# Patient Record
Sex: Male | Born: 1955 | Race: Black or African American | Hispanic: No | Marital: Married | Smoking: Never smoker
Health system: Southern US, Community
[De-identification: ages and names within clinical notes are randomized; demographics above are authoritative.]

## PROBLEM LIST (undated history)

## (undated) DIAGNOSIS — I10 Essential (primary) hypertension: Secondary | ICD-10-CM

## (undated) DIAGNOSIS — I509 Heart failure, unspecified: Secondary | ICD-10-CM

## (undated) DIAGNOSIS — E785 Hyperlipidemia, unspecified: Secondary | ICD-10-CM

## (undated) DIAGNOSIS — E119 Type 2 diabetes mellitus without complications: Secondary | ICD-10-CM

## (undated) HISTORY — PX: INGUINAL HERNIA REPAIR: SUR1180

## (undated) HISTORY — DX: Hyperlipidemia, unspecified: E78.5

---

## 2020-03-13 ENCOUNTER — Ambulatory Visit (HOSPITAL_COMMUNITY)
Admission: EM | Admit: 2020-03-13 | Discharge: 2020-03-13 | Disposition: A | Discharge status: Home / Self Care | Payer: Medicare Other | Attending: Family Medicine | Admitting: Family Medicine

## 2020-03-13 ENCOUNTER — Encounter (HOSPITAL_COMMUNITY): Payer: Self-pay

## 2020-03-13 ENCOUNTER — Other Ambulatory Visit: Payer: Self-pay

## 2020-03-13 DIAGNOSIS — E119 Type 2 diabetes mellitus without complications: Secondary | ICD-10-CM | POA: Diagnosis present

## 2020-03-13 DIAGNOSIS — I10 Essential (primary) hypertension: Secondary | ICD-10-CM | POA: Insufficient documentation

## 2020-03-13 DIAGNOSIS — Z760 Encounter for issue of repeat prescription: Secondary | ICD-10-CM | POA: Insufficient documentation

## 2020-03-13 HISTORY — DX: Essential (primary) hypertension: I10

## 2020-03-13 HISTORY — DX: Type 2 diabetes mellitus without complications: E11.9

## 2020-03-13 LAB — CBC
HCT: 44.3 % (ref 39.0–52.0)
Hemoglobin: 14.4 g/dL (ref 13.0–17.0)
MCH: 26.4 pg (ref 26.0–34.0)
MCHC: 32.5 g/dL (ref 30.0–36.0)
MCV: 81.3 fL (ref 80.0–100.0)
Platelets: 270 10*3/uL (ref 150–400)
RBC: 5.45 MIL/uL (ref 4.22–5.81)
RDW: 13.7 % (ref 11.5–15.5)
WBC: 5.2 10*3/uL (ref 4.0–10.5)
nRBC: 0 % (ref 0.0–0.2)

## 2020-03-13 LAB — COMPREHENSIVE METABOLIC PANEL
ALT: 29 U/L (ref 0–44)
AST: 24 U/L (ref 15–41)
Albumin: 4.2 g/dL (ref 3.5–5.0)
Alkaline Phosphatase: 63 U/L (ref 38–126)
Anion gap: 9 (ref 5–15)
BUN: 12 mg/dL (ref 8–23)
CO2: 28 mmol/L (ref 22–32)
Calcium: 9.6 mg/dL (ref 8.9–10.3)
Chloride: 101 mmol/L (ref 98–111)
Creatinine, Ser: 0.87 mg/dL (ref 0.61–1.24)
GFR calc Af Amer: >60 mL/min (ref >60)
GFR calc non Af Amer: >60 mL/min (ref >60)
Glucose, Bld: 134 mg/dL — ABNORMAL HIGH (ref 70–99)
Potassium: 4.3 mmol/L (ref 3.5–5.1)
Sodium: 138 mmol/L (ref 135–145)
Total Bilirubin: 0.6 mg/dL (ref 0.3–1.2)
Total Protein: 7.1 g/dL (ref 6.5–8.1)

## 2020-03-13 LAB — POCT URINALYSIS DIP (DEVICE)
Bilirubin Urine: NEGATIVE
Glucose, UA: 100 mg/dL — AB
Ketones, ur: NEGATIVE mg/dL
Leukocytes,Ua: NEGATIVE
Nitrite: NEGATIVE
Protein, ur: NEGATIVE mg/dL
Specific Gravity, Urine: >=1.03 (ref 1.005–1.030)
Urobilinogen, UA: 0.2 mg/dL (ref 0.0–1.0)
pH: 5.5 (ref 5.0–8.0)

## 2020-03-13 LAB — HEMOGLOBIN A1C
Hgb A1c MFr Bld: 7.4 % — ABNORMAL HIGH (ref 4.8–5.6)
Mean Plasma Glucose: 165.68 mg/dL

## 2020-03-13 MED ORDER — LINAGLIPTIN 5 MG PO TABS: 5.0000 mg | ORAL_TABLET | Freq: Every day | ORAL | 0 refills | Status: DC

## 2020-03-13 MED ORDER — LISINOPRIL 10 MG PO TABS: 10.0000 mg | ORAL_TABLET | Freq: Every day | ORAL | 0 refills | Status: DC

## 2020-03-13 MED ORDER — METOPROLOL SUCCINATE ER 25 MG PO TB24: 25.0000 mg | ORAL_TABLET | Freq: Every day | ORAL | 0 refills | Status: DC

## 2020-03-13 MED ORDER — ATORVASTATIN CALCIUM 20 MG PO TABS: 20.0000 mg | ORAL_TABLET | Freq: Every day | ORAL | 0 refills | Status: DC

## 2020-03-13 MED ORDER — GLYBURIDE 5 MG PO TABS: 5.0000 mg | ORAL_TABLET | Freq: Two times a day (BID) | ORAL | 0 refills | Status: DC

## 2020-03-13 NOTE — ED Triage Notes (Signed)
Pt states he needs prescription refills for Lisinopril 10mg , glyburide 5mg , atorvastatin 20mg , trajentin 5mg , metoprolol 25mg . Pt states he moved here from and doesn't have a PCP yet.

## 2020-03-13 NOTE — ED Provider Notes (Signed)
[facility]    CSN: [id number] Arrival date & time: 03/13/20  1315      History   Chief Complaint Chief Complaint  Patient presents with  . medication refill    HPI Devin Goodman is a 64 y.o. male.   Devin Goodman presents with requests for medication refill. He moved here from [place] in November 2020 and hasn't yet established with a PCP. He otherwise feels well, no headache, no dizziness, no chest pain , no leg swelling. He states that his hga1c had improved at his last visit as far as he knows, he thought it was slightly over 7 but isn't sure exactly. No shortness of breath . Ran out of lisinopril a few weeks ago and is almost out of the other medications. Taking metoprolol, trajenta, glyburide, lisinopril, atorvastatin.    ROS per HPI, negative if not otherwise mentioned.      Past Medical History:  Diagnosis Date  . Diabetes mellitus without complication (HCC)   . Hypertension     There are no problems to display for this patient.   Past Surgical History:  Procedure Laterality Date  . INGUINAL HERNIA REPAIR Left        Home Medications    Prior to Admission medications   Medication Sig Start Date End Date Taking? Authorizing Provider  atorvastatin (LIPITOR) 20 MG tablet Take 1 tablet (20 mg total) by mouth daily. 03/13/20   Georgetta Haber, NP  glyBURIDE (DIABETA) 5 MG tablet Take 1 tablet (5 mg total) by mouth 2 (two) times daily with a meal. 03/13/20   Avaline Stillson, Barron Alvine, NP  linagliptin (TRADJENTA) 5 MG TABS tablet Take 1 tablet (5 mg total) by mouth daily. 03/13/20   Linus Mako B, NP  lisinopril (ZESTRIL) 10 MG tablet Take 1 tablet (10 mg total) by mouth daily. 03/13/20   Georgetta Haber, NP  metoprolol succinate (TOPROL XL) 25 MG 24 hr tablet Take 1 tablet (25 mg total) by mouth daily. 03/13/20   Georgetta Haber, NP    Family History Family History  Problem Relation Age of Onset  . Cancer Mother     Social History Social History    Tobacco Use  . Smoking status: Never Smoker  . Smokeless tobacco: Never Used  Substance Use Topics  . Alcohol use: Yes    Comment: occ  . Drug use: Never     Allergies   Penicillins   Review of Systems Review of Systems   Physical Exam Triage Vital Signs ED Triage Vitals [03/13/20 1418]  Enc Vitals Group     BP (!) 145/93     Pulse Rate 92     Resp 18     Temp 98.4 F (36.9 C)     Temp Source Oral     SpO2 95 %     Weight 195 lb (88.5 kg)     Height 5\' 11"  (1.803 m)     Head Circumference      Peak Flow      Pain Score 0     Pain Loc      Pain Edu?      Excl. in GC?    No data found.  Updated Vital Signs BP (!) 145/93 (BP Location: Left Arm)   Pulse 92   Temp 98.4 F (36.9 C) (Oral)   Resp 18   Ht 5\' 11"  (1.803 m)   Wt 195 lb (88.5 kg)   SpO2 95%   BMI 27.20  kg/m   Visual Acuity Right Eye Distance:   Left Eye Distance:   Bilateral Distance:    Right Eye Near:   Left Eye Near:    Bilateral Near:     Physical Exam Constitutional:      Appearance: He is well-developed.  Cardiovascular:     Rate and Rhythm: Normal rate.  Pulmonary:     Effort: Pulmonary effort is normal.  Skin:    General: Skin is warm and dry.  Neurological:     Mental Status: He is alert and oriented to person, place, and time.      UC Treatments / Results  Labs (all labs ordered are listed, but only abnormal results are displayed) Labs Reviewed  POCT URINALYSIS DIP (DEVICE) - Abnormal; Notable for the following components:      Result Value   Glucose, UA 100 (*)    Hgb urine dipstick SMALL (*)    All other components within normal limits  CBC  COMPREHENSIVE METABOLIC PANEL  HEMOGLOBIN A1C    EKG   Radiology No results found.  Procedures Procedures (including critical care time)  Medications Ordered in UC Medications - No data to display  Initial Impression / Assessment and Plan / UC Course  I have reviewed the triage vital signs and the nursing  notes.  Pertinent labs & imaging results that were available during my care of the patient were reviewed by me and considered in my medical decision making (see chart for details).     30 day supply of medications refilled after confirming with [facility]. They state he had been on lisinopril 5 mg as well. He states he had been taking 10mg  most recently, therefore this dose was refilled today. Basic labs obtained with encouragement to follow up with a PCP for recheck and management of medications long term. Patient verbalized understanding and agreeable to plan.   Final Clinical Impressions(s) / UC Diagnoses   Final diagnoses:  Hypertension, unspecified type  Type 2 diabetes mellitus without complication, without long-term current use of insulin (HCC)  Medication refill     Discharge Instructions     I have refilled your medications for 30 days. Please call to establish with a PCP for long term management of these medications, I have listed two options, or you may also explore the [facility] website for additional options.    ED Prescriptions    Medication Sig Dispense Auth. Provider   lisinopril (ZESTRIL) 10 MG tablet Take 1 tablet (10 mg total) by mouth daily. 30 tablet Augusto Gamble B, NP   linagliptin (TRADJENTA) 5 MG TABS tablet Take 1 tablet (5 mg total) by mouth daily. 30 tablet Augusto Gamble B, NP   glyBURIDE (DIABETA) 5 MG tablet Take 1 tablet (5 mg total) by mouth 2 (two) times daily with a meal. 60 tablet Augusto Gamble B, NP   metoprolol succinate (TOPROL XL) 25 MG 24 hr tablet Take 1 tablet (25 mg total) by mouth daily. 30 tablet Augusto Gamble B, NP   atorvastatin (LIPITOR) 20 MG tablet Take 1 tablet (20 mg total) by mouth daily. 30 tablet Zigmund Gottron, NP     PDMP not reviewed this encounter.   Zigmund Gottron, NP 03/13/20 1525

## 2020-03-13 NOTE — Discharge Instructions (Signed)
I have refilled your medications for 30 days. Please call to establish with a PCP for long term management of these medications, I have listed two options, or you may also explore the [facility] website for additional options.

## 2020-03-14 ENCOUNTER — Telehealth (HOSPITAL_COMMUNITY): Payer: Self-pay | Admitting: *Deleted

## 2020-03-14 NOTE — Telephone Encounter (Signed)
Reviewed labs with patient. Education provided on importance of compliance with diabetes medication. Patient was able to fill prescriptions with no issues. No questions.

## 2020-05-26 ENCOUNTER — Ambulatory Visit: Payer: Self-pay | Admitting: Family Medicine

## 2020-07-04 ENCOUNTER — Encounter: Payer: Self-pay | Admitting: Family Medicine

## 2020-07-04 ENCOUNTER — Other Ambulatory Visit: Payer: Self-pay

## 2020-07-04 ENCOUNTER — Ambulatory Visit (INDEPENDENT_AMBULATORY_CARE_PROVIDER_SITE_OTHER): Payer: Medicare Other | Admitting: Family Medicine

## 2020-07-04 VITALS — BP 152/91 | HR 78 | Temp 98.2°F | Ht 71.5 in | Wt 192.2 lb

## 2020-07-04 DIAGNOSIS — E785 Hyperlipidemia, unspecified: Secondary | ICD-10-CM | POA: Insufficient documentation

## 2020-07-04 DIAGNOSIS — R319 Hematuria, unspecified: Secondary | ICD-10-CM

## 2020-07-04 DIAGNOSIS — E1165 Type 2 diabetes mellitus with hyperglycemia: Secondary | ICD-10-CM | POA: Diagnosis not present

## 2020-07-04 DIAGNOSIS — I10 Essential (primary) hypertension: Secondary | ICD-10-CM | POA: Diagnosis not present

## 2020-07-04 MED ORDER — ATORVASTATIN CALCIUM 20 MG PO TABS: 20.0000 mg | ORAL_TABLET | Freq: Every day | ORAL | 3 refills | Status: DC

## 2020-07-04 MED ORDER — LISINOPRIL 10 MG PO TABS: 10.0000 mg | ORAL_TABLET | Freq: Every day | ORAL | 3 refills | Status: DC

## 2020-07-04 MED ORDER — GLYBURIDE 5 MG PO TABS: 5.0000 mg | ORAL_TABLET | Freq: Two times a day (BID) | ORAL | 3 refills | Status: DC

## 2020-07-04 MED ORDER — METOPROLOL SUCCINATE ER 25 MG PO TB24: 25.0000 mg | ORAL_TABLET | Freq: Every day | ORAL | 3 refills | Status: DC

## 2020-07-04 NOTE — Patient Instructions (Signed)
It was very nice to see you today!  Please work on the exercises.  No medication changes today.  We will check a urine sample today.  I will see back in 6 months. Please come back to see me sooner if needed.  Take care, Dr [name]  Please try these tips to maintain a healthy lifestyle:   Eat at least 3 REAL meals and 1-2 snacks per day.  Aim for no more than 5 hours between eating.  If you eat breakfast, please do so within one hour of getting up.    Each meal should contain half fruits/vegetables, one quarter protein, and one quarter carbs (no bigger than a computer mouse)   Cut down on sweet beverages. This includes juice, soda, and sweet tea.     Drink at least 1 glass of water with each meal and aim for at least 8 glasses per day   Exercise at least 150 minutes every week.

## 2020-07-04 NOTE — Progress Notes (Signed)
Devin Goodman is a 64 y.o. male who presents today for an office visit.  Assessment/Plan:  New/Acute Problems: Polyuria UA from a few months ago showed glucosuria and hematuria. Will recheck UA today and urine culture. May have BPH though it has persistent hematuria will likely need referral to urology.  Hip Flexor Weakness Discussed home exercises and handout was given. If no improvement would consider referral to PT.  Chronic Problems Addressed Today: Essential hypertension Above goal though typically well controlled per patient. Will continue lisinopril 10 mg daily and metoprolol succinate 25 mg daily.  Dyslipidemia Continue Lipitor 20 mg daily. Check records for most recent lipid panel.  Type 2 diabetes mellitus with hyperglycemia (HCC) A1c 6.8. Continue Tradjenta 5 mg daily and glyburide 5 mg twice daily. Recheck 6 months.     Subjective:  HPI:  Patient is here today as a new patient. He has noticed increasing urinary frequency at night over the last several months. No dysuria. No penile discharge. Was reportedly checked for prostate cancer the past which was negative.  Is also been having some calf and hip pain. Thinks this is due to deconditioning. No obvious injuries or precipitating events.   His stable, chronic medical conditions are outlined below:  # T2DM - On tradjenta 5mg  daily and glyburide 5mg  twice daily  # Essential Hypertension - On lisinopril 10mg  daily, metoprolol 25mg  daily  and tolerating well  # Dyslipidemia - On atorvastatin 20mg  daily and tolerating well  PMH:  The following were reviewed and entered/updated in epic: Past Medical History:  Diagnosis Date  . Diabetes mellitus without complication (HCC)   . Hyperlipidemia   . Hypertension    Patient Active Problem List   Diagnosis Date Noted  . Type 2 diabetes mellitus with hyperglycemia (HCC) 07/04/2020  . Dyslipidemia 07/04/2020  . Essential hypertension 07/04/2020   Past  Surgical History:  Procedure Laterality Date  . INGUINAL HERNIA REPAIR Left     Family History  Problem Relation Age of Onset  . Cancer Mother     Medications- reviewed and updated Current Outpatient Medications  Medication Sig Dispense Refill  . atorvastatin (LIPITOR) 20 MG tablet Take 1 tablet (20 mg total) by mouth daily. 90 tablet 3  . glyBURIDE (DIABETA) 5 MG tablet Take 1 tablet (5 mg total) by mouth 2 (two) times daily with a meal. 180 tablet 3  . linagliptin (TRADJENTA) 5 MG TABS tablet Take 1 tablet (5 mg total) by mouth daily. 30 tablet 0  . lisinopril (ZESTRIL) 10 MG tablet Take 1 tablet (10 mg total) by mouth daily. 90 tablet 3  . metoprolol succinate (TOPROL XL) 25 MG 24 hr tablet Take 1 tablet (25 mg total) by mouth daily. 90 tablet 3   No current facility-administered medications for this visit.    Allergies-reviewed and updated Allergies  Allergen Reactions  . Penicillins     vomiting    Social History   Socioeconomic History  . Marital status: Married    Spouse name: Not on file  . Number of children: Not on file  . Years of education: Not on file  . Highest education level: Not on file  Occupational History  . Not on file  Tobacco Use  . Smoking status: Never Smoker  . Smokeless tobacco: Never Used  Vaping Use  . Vaping Use: Never used  Substance and Sexual Activity  . Alcohol use: Yes    Comment: occ  . Drug use: Never  . Sexual activity: Not  on file  Other Topics Concern  . Not on file  Social History Narrative  . Not on file   Social Determinants of Health   Financial Resource Strain:   . Difficulty of Paying Living Expenses:   Food Insecurity:   . Worried About Programme researcher, broadcasting/film/video in the Last Year:   . Barista in the Last Year:   Transportation Needs:   . Freight forwarder (Medical):   Marland Kitchen Lack of Transportation (Non-Medical):   Physical Activity:   . Days of Exercise per Week:   . Minutes of Exercise per Session:     Stress:   . Feeling of Stress :   Social Connections:   . Frequency of Communication with Friends and Family:   . Frequency of Social Gatherings with Friends and Family:   . Attends Religious Services:   . Active Member of Clubs or Organizations:   . Attends Banker Meetings:   Marland Kitchen Marital Status:            Objective:  Physical Exam: BP (!) 152/91   Pulse 78   Temp 98.2 F (36.8 C)   Ht 5' 11.5" (1.816 m)   Wt 192 lb 3.2 oz (87.2 kg)   SpO2 97%   BMI 26.43 kg/m   Gen: No acute distress, resting comfortably CV: Regular rate and rhythm with no murmurs appreciated Pulm: Normal work of breathing, clear to auscultation bilaterally with no crackles, wheezes, or rhonchi MSK: -Bilateral hip flexor weakness noted. Some pain with resisted hip flexion. Neuro: Grossly normal, moves all extremities Psych: Normal affect and thought content      Maaliyah Adolph M. [name], MD 07/04/2020 3:29 PM

## 2020-07-04 NOTE — Assessment & Plan Note (Signed)
Continue Lipitor 20 mg daily. Check records for most recent lipid panel.

## 2020-07-04 NOTE — Addendum Note (Signed)
Addended by: Erick Alley on: 07/04/2020 04:14 PM   Modules accepted: Orders

## 2020-07-04 NOTE — Assessment & Plan Note (Signed)
A1c 6.8. Continue Tradjenta 5 mg daily and glyburide 5 mg twice daily. Recheck 6 months.

## 2020-07-04 NOTE — Assessment & Plan Note (Signed)
Above goal though typically well controlled per patient. Will continue lisinopril 10 mg daily and metoprolol succinate 25 mg daily.

## 2020-07-05 LAB — URINALYSIS, ROUTINE W REFLEX MICROSCOPIC
Bilirubin Urine: NEGATIVE
Leukocytes,Ua: NEGATIVE
Nitrite: NEGATIVE
Specific Gravity, Urine: 1.025 (ref 1.000–1.030)
Total Protein, Urine: NEGATIVE
Urine Glucose: 100 — AB
Urobilinogen, UA: 0.2 (ref 0.0–1.0)
pH: 6 (ref 5.0–8.0)

## 2020-07-05 LAB — URINE CULTURE
MICRO NUMBER:: 10670301
Result:: NO GROWTH
SPECIMEN QUALITY:: ADEQUATE

## 2020-07-05 LAB — POCT GLYCOSYLATED HEMOGLOBIN (HGB A1C): Hemoglobin A1C: 6.8 % — AB (ref 4.0–5.6)

## 2020-07-05 NOTE — Addendum Note (Signed)
Addended by: Erick Alley on: 07/05/2020 08:15 AM   Modules accepted: Orders

## 2020-07-06 ENCOUNTER — Other Ambulatory Visit: Payer: Self-pay

## 2020-07-06 DIAGNOSIS — R319 Hematuria, unspecified: Secondary | ICD-10-CM

## 2020-07-06 NOTE — Progress Notes (Signed)
Please inform patient of the following:  Still has blood in his urine - recommend referral to urology.  [name]. [name], MD 07/06/2020 12:36 PM

## 2020-07-26 NOTE — Progress Notes (Signed)
Order(s) created erroneously. Erroneous order ID: [id number]  Order moved by: Arvie Villarruel, Keshaun Dubey N  Order move date/time: 07/26/2020 11:59 AM  Source Patient: [id number]  Source Contact: 07/04/2020  Destination Patient: [id number]  Destination Contact: 03/16/2013 

## 2020-10-31 ENCOUNTER — Telehealth: Payer: Self-pay | Admitting: Family Medicine

## 2020-10-31 NOTE — Telephone Encounter (Signed)
Left message for patient to call back and schedule Medicare Annual Wellness Visit (AWV) either virtually/audio only OR in office. Whatever the patients preference is.  No hx; please schedule at anytime with [facility]-Nurse Health Advisor at [facility].  This should be a 45 minute visit.   

## 2021-01-04 ENCOUNTER — Ambulatory Visit: Payer: Medicare Other | Admitting: Family Medicine

## 2021-01-08 ENCOUNTER — Other Ambulatory Visit: Payer: Self-pay

## 2021-01-08 ENCOUNTER — Encounter: Payer: Self-pay | Admitting: Family Medicine

## 2021-01-08 ENCOUNTER — Ambulatory Visit (INDEPENDENT_AMBULATORY_CARE_PROVIDER_SITE_OTHER): Payer: Medicare Other | Admitting: Family Medicine

## 2021-01-08 VITALS — BP 124/69 | HR 97 | Temp 97.5°F | Ht 71.5 in | Wt 183.8 lb

## 2021-01-08 DIAGNOSIS — Z23 Encounter for immunization: Secondary | ICD-10-CM | POA: Diagnosis not present

## 2021-01-08 DIAGNOSIS — E1165 Type 2 diabetes mellitus with hyperglycemia: Secondary | ICD-10-CM | POA: Diagnosis not present

## 2021-01-08 DIAGNOSIS — I10 Essential (primary) hypertension: Secondary | ICD-10-CM | POA: Diagnosis not present

## 2021-01-08 LAB — POCT GLYCOSYLATED HEMOGLOBIN (HGB A1C): Hemoglobin A1C: 6 % — AB (ref 4.0–5.6)

## 2021-01-08 NOTE — Progress Notes (Signed)
   Devin Goodman is a 65 y.o. male who presents today for an office visit.  Assessment/Plan:  New/Acute Problems: Abdominal Pain Paraflex.  Reassuring abdominal exam.  Likely muscular strain given positional nature.  Decreased sensation on index finger No apparent abnormalities on exam.  Has some constipation which is likely contributing.  May have small amount of peripheral neuropathy.  Cough Potential side effect of lisinopril.Will stop today. Normal lung exam.   Chronic Problems Addressed Today: Essential hypertension Doing well.  Cough could be side effect of lisinopril.  We will stop today.  Continue home monitoring goal 140/90 or lower.  Follow-up in 6 months.  Type 2 diabetes mellitus with hyperglycemia (HCC) A1c very well controlled at 6.0.  Continue Tradjenta 5 mg daily and glyburide 5 mg twice daily.  Recheck 6 months.  If continues to be well controlled we will consider stopping or reducing dose of glyburide.     Subjective:  HPI:  See A/P for status of chronic conditions.  He has been doing well for the last several months.  He has bit of a dry cough.  No fevers or chills.  No sputum production.  Is also had intermittent left-sided abdominal pain.  No nausea or vomiting.  No constipation or diarrhea.  Occurs with certain motions such as stretching.       Objective:  Physical Exam: BP 124/69   Pulse 97   Temp (!) 97.5 F (36.4 C) (Temporal)   Ht 5' 11.5" (1.816 m)   Wt 183 lb 12.8 oz (83.4 kg)   SpO2 100%   BMI 25.28 kg/m   Wt Readings from Last 3 Encounters:  01/08/21 183 lb 12.8 oz (83.4 kg)  07/04/20 192 lb 3.2 oz (87.2 kg)  03/13/20 195 lb (88.5 kg)    Gen: No acute distress, resting comfortably CV: Regular rate and rhythm with no murmurs appreciated Pulm: Normal work of breathing, clear to auscultation bilaterally with no crackles, wheezes, or rhonchi GI: Soft, nontender, nondistended.  No rebound or guarding.  Bowel sounds present. Neuro:  Grossly normal, moves all extremities Psych: Normal affect and thought content      Devin Goodman M. [name], MD 01/08/2021 3:42 PM

## 2021-01-08 NOTE — Assessment & Plan Note (Signed)
A1c very well controlled at 6.0.  Continue Tradjenta 5 mg daily and glyburide 5 mg twice daily.  Recheck 6 months.  If continues to be well controlled we will consider stopping or reducing dose of glyburide.

## 2021-01-08 NOTE — Patient Instructions (Signed)
It was very nice to see you today!  Please try stopping lisinopril to see if this helps with your cough.  Your numbers look very good.  Please continue the good work with your diet and exercise.  I will see you back in 6 months.  We can check blood work at that time.  Please come back to see me sooner if needed.  Take care, Dr [name]  Please try these tips to maintain a healthy lifestyle:   Eat at least 3 REAL meals and 1-2 snacks per day.  Aim for no more than 5 hours between eating.  If you eat breakfast, please do so within one hour of getting up.    Each meal should contain half fruits/vegetables, one quarter protein, and one quarter carbs (no bigger than a computer mouse)   Cut down on sweet beverages. This includes juice, soda, and sweet tea.     Drink at least 1 glass of water with each meal and aim for at least 8 glasses per day   Exercise at least 150 minutes every week.

## 2021-01-08 NOTE — Assessment & Plan Note (Signed)
Doing well.  Cough could be side effect of lisinopril.  We will stop today.  Continue home monitoring goal 140/90 or lower.  Follow-up in 6 months.

## 2021-02-08 ENCOUNTER — Encounter (HOSPITAL_COMMUNITY): Payer: Self-pay | Admitting: Emergency Medicine

## 2021-02-08 ENCOUNTER — Emergency Department (HOSPITAL_COMMUNITY): Payer: Medicare Other

## 2021-02-08 ENCOUNTER — Inpatient Hospital Stay (HOSPITAL_COMMUNITY): Payer: Medicare Other

## 2021-02-08 ENCOUNTER — Other Ambulatory Visit: Payer: Self-pay

## 2021-02-08 ENCOUNTER — Inpatient Hospital Stay (HOSPITAL_COMMUNITY)
Admission: EM | Admit: 2021-02-08 | Discharge: 2021-02-14 | DRG: 286 | Disposition: A | Discharge status: Home / Self Care | Payer: Medicare Other | Attending: Internal Medicine | Admitting: Internal Medicine

## 2021-02-08 DIAGNOSIS — Z880 Allergy status to penicillin: Secondary | ICD-10-CM

## 2021-02-08 DIAGNOSIS — I471 Supraventricular tachycardia: Secondary | ICD-10-CM | POA: Diagnosis not present

## 2021-02-08 DIAGNOSIS — I161 Hypertensive emergency: Secondary | ICD-10-CM | POA: Diagnosis present

## 2021-02-08 DIAGNOSIS — N170 Acute kidney failure with tubular necrosis: Secondary | ICD-10-CM | POA: Diagnosis present

## 2021-02-08 DIAGNOSIS — J810 Acute pulmonary edema: Secondary | ICD-10-CM | POA: Diagnosis present

## 2021-02-08 DIAGNOSIS — Z7984 Long term (current) use of oral hypoglycemic drugs: Secondary | ICD-10-CM | POA: Diagnosis not present

## 2021-02-08 DIAGNOSIS — I5021 Acute systolic (congestive) heart failure: Secondary | ICD-10-CM | POA: Diagnosis not present

## 2021-02-08 DIAGNOSIS — N179 Acute kidney failure, unspecified: Secondary | ICD-10-CM | POA: Diagnosis present

## 2021-02-08 DIAGNOSIS — E1165 Type 2 diabetes mellitus with hyperglycemia: Secondary | ICD-10-CM | POA: Diagnosis present

## 2021-02-08 DIAGNOSIS — I514 Myocarditis, unspecified: Secondary | ICD-10-CM | POA: Diagnosis not present

## 2021-02-08 DIAGNOSIS — I110 Hypertensive heart disease with heart failure: Secondary | ICD-10-CM | POA: Diagnosis not present

## 2021-02-08 DIAGNOSIS — E785 Hyperlipidemia, unspecified: Secondary | ICD-10-CM | POA: Diagnosis not present

## 2021-02-08 DIAGNOSIS — Z79899 Other long term (current) drug therapy: Secondary | ICD-10-CM | POA: Diagnosis not present

## 2021-02-08 DIAGNOSIS — E876 Hypokalemia: Secondary | ICD-10-CM | POA: Diagnosis not present

## 2021-02-08 DIAGNOSIS — I5020 Unspecified systolic (congestive) heart failure: Secondary | ICD-10-CM | POA: Diagnosis not present

## 2021-02-08 DIAGNOSIS — J9601 Acute respiratory failure with hypoxia: Secondary | ICD-10-CM

## 2021-02-08 DIAGNOSIS — I428 Other cardiomyopathies: Secondary | ICD-10-CM | POA: Diagnosis present

## 2021-02-08 DIAGNOSIS — R570 Cardiogenic shock: Secondary | ICD-10-CM | POA: Diagnosis present

## 2021-02-08 DIAGNOSIS — I160 Hypertensive urgency: Secondary | ICD-10-CM | POA: Diagnosis not present

## 2021-02-08 DIAGNOSIS — R9389 Abnormal findings on diagnostic imaging of other specified body structures: Secondary | ICD-10-CM | POA: Diagnosis not present

## 2021-02-08 DIAGNOSIS — Z20822 Contact with and (suspected) exposure to covid-19: Secondary | ICD-10-CM | POA: Diagnosis present

## 2021-02-08 DIAGNOSIS — J90 Pleural effusion, not elsewhere classified: Secondary | ICD-10-CM | POA: Diagnosis present

## 2021-02-08 DIAGNOSIS — E875 Hyperkalemia: Secondary | ICD-10-CM | POA: Diagnosis not present

## 2021-02-08 DIAGNOSIS — I5022 Chronic systolic (congestive) heart failure: Secondary | ICD-10-CM

## 2021-02-08 LAB — I-STAT ARTERIAL BLOOD GAS, ED
Acid-base deficit: 2 mmol/L (ref 0.0–2.0)
Bicarbonate: 25.8 mmol/L (ref 20.0–28.0)
Calcium, Ion: 1.21 mmol/L (ref 1.15–1.40)
HCT: 53 % — ABNORMAL HIGH (ref 39.0–52.0)
Hemoglobin: 18 g/dL — ABNORMAL HIGH (ref 13.0–17.0)
O2 Saturation: 81 %
Patient temperature: 98.1
Potassium: 4.1 mmol/L (ref 3.5–5.1)
Sodium: 136 mmol/L (ref 135–145)
TCO2: 27 mmol/L (ref 22–32)
pCO2 arterial: 54.8 mmHg — ABNORMAL HIGH (ref 32.0–48.0)
pH, Arterial: 7.279 — ABNORMAL LOW (ref 7.350–7.450)
pO2, Arterial: 51 mmHg — ABNORMAL LOW (ref 83.0–108.0)

## 2021-02-08 LAB — I-STAT VENOUS BLOOD GAS, ED
Acid-base deficit: 3 mmol/L — ABNORMAL HIGH (ref 0.0–2.0)
Bicarbonate: 25.5 mmol/L (ref 20.0–28.0)
Calcium, Ion: 1.11 mmol/L — ABNORMAL LOW (ref 1.15–1.40)
HCT: 54 % — ABNORMAL HIGH (ref 39.0–52.0)
Hemoglobin: 18.4 g/dL — ABNORMAL HIGH (ref 13.0–17.0)
O2 Saturation: 74 %
Potassium: 3.8 mmol/L (ref 3.5–5.1)
Sodium: 138 mmol/L (ref 135–145)
TCO2: 27 mmol/L (ref 22–32)
pCO2, Ven: 58.6 mmHg (ref 44.0–60.0)
pH, Ven: 7.246 — ABNORMAL LOW (ref 7.250–7.430)
pO2, Ven: 47 mmHg — ABNORMAL HIGH (ref 32.0–45.0)

## 2021-02-08 LAB — COMPREHENSIVE METABOLIC PANEL
ALT: 51 U/L — ABNORMAL HIGH (ref 0–44)
AST: 47 U/L — ABNORMAL HIGH (ref 15–41)
Albumin: 4 g/dL (ref 3.5–5.0)
Alkaline Phosphatase: 76 U/L (ref 38–126)
Anion gap: 14 (ref 5–15)
BUN: 23 mg/dL (ref 8–23)
CO2: 18 mmol/L — ABNORMAL LOW (ref 22–32)
Calcium: 9.1 mg/dL (ref 8.9–10.3)
Chloride: 106 mmol/L (ref 98–111)
Creatinine, Ser: 1.31 mg/dL — ABNORMAL HIGH (ref 0.61–1.24)
GFR, Estimated: >60 mL/min (ref >60)
Glucose, Bld: 211 mg/dL — ABNORMAL HIGH (ref 70–99)
Potassium: 5.1 mmol/L (ref 3.5–5.1)
Sodium: 138 mmol/L (ref 135–145)
Total Bilirubin: 1 mg/dL (ref 0.3–1.2)
Total Protein: 7.4 g/dL (ref 6.5–8.1)

## 2021-02-08 LAB — BASIC METABOLIC PANEL
Anion gap: 14 (ref 5–15)
BUN: 23 mg/dL (ref 8–23)
CO2: 23 mmol/L (ref 22–32)
Calcium: 9.2 mg/dL (ref 8.9–10.3)
Chloride: 101 mmol/L (ref 98–111)
Creatinine, Ser: 1.29 mg/dL — ABNORMAL HIGH (ref 0.61–1.24)
GFR, Estimated: >60 mL/min (ref >60)
Glucose, Bld: 279 mg/dL — ABNORMAL HIGH (ref 70–99)
Potassium: 3.8 mmol/L (ref 3.5–5.1)
Sodium: 138 mmol/L (ref 135–145)

## 2021-02-08 LAB — CBC
HCT: 56.6 % — ABNORMAL HIGH (ref 39.0–52.0)
HCT: 50.1 % (ref 39.0–52.0)
Hemoglobin: 17.4 g/dL — ABNORMAL HIGH (ref 13.0–17.0)
Hemoglobin: 16 g/dL (ref 13.0–17.0)
MCH: 25.7 pg — ABNORMAL LOW (ref 26.0–34.0)
MCH: 26.3 pg (ref 26.0–34.0)
MCHC: 30.7 g/dL (ref 30.0–36.0)
MCHC: 31.9 g/dL (ref 30.0–36.0)
MCV: 83.7 fL (ref 80.0–100.0)
MCV: 82.3 fL (ref 80.0–100.0)
Platelets: 319 10*3/uL (ref 150–400)
Platelets: 280 10*3/uL (ref 150–400)
RBC: 6.76 MIL/uL — ABNORMAL HIGH (ref 4.22–5.81)
RBC: 6.09 MIL/uL — ABNORMAL HIGH (ref 4.22–5.81)
RDW: 14.6 % (ref 11.5–15.5)
RDW: 14.2 % (ref 11.5–15.5)
WBC: 7.5 10*3/uL (ref 4.0–10.5)
WBC: 14.9 10*3/uL — ABNORMAL HIGH (ref 4.0–10.5)
nRBC: 0 % (ref 0.0–0.2)
nRBC: 0 % (ref 0.0–0.2)

## 2021-02-08 LAB — ECHOCARDIOGRAM COMPLETE
AR max vel: 2.25 cm2
AV Area VTI: 2.12 cm2
AV Area mean vel: 1.8 cm2
AV Mean grad: 2 mmHg
AV Peak grad: 3.5 mmHg
Ao pk vel: 0.93 m/s
Area-P 1/2: 2.8 cm2
Height: 72 in
MV VTI: 1.4 cm2
S' Lateral: 5.5 cm
Weight: 2895.96 oz

## 2021-02-08 LAB — RESP PANEL BY RT-PCR (FLU A&B, COVID) ARPGX2
Influenza A by PCR: NEGATIVE
Influenza B by PCR: NEGATIVE
SARS Coronavirus 2 by RT PCR: NEGATIVE

## 2021-02-08 LAB — GLUCOSE, CAPILLARY
Glucose-Capillary: 112 mg/dL — ABNORMAL HIGH (ref 70–99)
Glucose-Capillary: 116 mg/dL — ABNORMAL HIGH (ref 70–99)
Glucose-Capillary: 121 mg/dL — ABNORMAL HIGH (ref 70–99)
Glucose-Capillary: 145 mg/dL — ABNORMAL HIGH (ref 70–99)
Glucose-Capillary: 165 mg/dL — ABNORMAL HIGH (ref 70–99)
Glucose-Capillary: 271 mg/dL — ABNORMAL HIGH (ref 70–99)

## 2021-02-08 LAB — PROTIME-INR
INR: 1 (ref 0.8–1.2)
Prothrombin Time: 13 seconds (ref 11.4–15.2)

## 2021-02-08 LAB — PROCALCITONIN: Procalcitonin: <0.1 ng/mL

## 2021-02-08 LAB — POCT I-STAT 7, (LYTES, BLD GAS, ICA,H+H)
Acid-base deficit: 2 mmol/L (ref 0.0–2.0)
Bicarbonate: 23.8 mmol/L (ref 20.0–28.0)
Calcium, Ion: 1.22 mmol/L (ref 1.15–1.40)
HCT: 48 % (ref 39.0–52.0)
Hemoglobin: 16.3 g/dL (ref 13.0–17.0)
O2 Saturation: 99 %
Patient temperature: 98.8
Potassium: 4.2 mmol/L (ref 3.5–5.1)
Sodium: 140 mmol/L (ref 135–145)
TCO2: 25 mmol/L (ref 22–32)
pCO2 arterial: 41.9 mmHg (ref 32.0–48.0)
pH, Arterial: 7.362 (ref 7.350–7.450)
pO2, Arterial: 169 mmHg — ABNORMAL HIGH (ref 83.0–108.0)

## 2021-02-08 LAB — HIV ANTIBODY (ROUTINE TESTING W REFLEX): HIV Screen 4th Generation wRfx: NONREACTIVE

## 2021-02-08 LAB — LACTIC ACID, PLASMA
Lactic Acid, Venous: 4.1 mmol/L (ref 0.5–1.9)
Lactic Acid, Venous: 3 mmol/L (ref 0.5–1.9)
Lactic Acid, Venous: 2.2 mmol/L (ref 0.5–1.9)

## 2021-02-08 LAB — BRAIN NATRIURETIC PEPTIDE: B Natriuretic Peptide: 501.9 pg/mL — ABNORMAL HIGH (ref 0.0–100.0)

## 2021-02-08 LAB — APTT: aPTT: 22 seconds — ABNORMAL LOW (ref 24–36)

## 2021-02-08 LAB — MRSA PCR SCREENING: MRSA by PCR: NEGATIVE

## 2021-02-08 LAB — PHOSPHORUS: Phosphorus: 5.6 mg/dL — ABNORMAL HIGH (ref 2.5–4.6)

## 2021-02-08 LAB — MAGNESIUM: Magnesium: 2.2 mg/dL (ref 1.7–2.4)

## 2021-02-08 LAB — TROPONIN I (HIGH SENSITIVITY)
Troponin I (High Sensitivity): 26 ng/L — ABNORMAL HIGH (ref <18)
Troponin I (High Sensitivity): 45 ng/L — ABNORMAL HIGH (ref <18)
Troponin I (High Sensitivity): 173 ng/L (ref <18)

## 2021-02-08 MED ORDER — PROPOFOL 1000 MG/100ML IV EMUL: 0.0000 ug/kg/min | INTRAVENOUS | Status: DC

## 2021-02-08 MED ORDER — FUROSEMIDE 10 MG/ML IJ SOLN
40.0000 mg | Freq: Once | INTRAMUSCULAR | Status: AC
  Administered 2021-02-08: 40 mg via INTRAVENOUS

## 2021-02-08 MED ORDER — NITROGLYCERIN IN D5W 200-5 MCG/ML-% IV SOLN
INTRAVENOUS | Status: AC
  Administered 2021-02-08: 20 ug/min via INTRAVENOUS
  Filled 2021-02-08: qty 250

## 2021-02-08 MED ORDER — SODIUM CHLORIDE 0.9 % IV SOLN: 250.0000 mL | INTRAVENOUS | Status: DC

## 2021-02-08 MED ORDER — VANCOMYCIN HCL 1500 MG/300ML IV SOLN
1500.0000 mg | Freq: Once | INTRAVENOUS | Status: DC
  Administered 2021-02-08: 1500 mg via INTRAVENOUS
  Filled 2021-02-08: qty 300

## 2021-02-08 MED ORDER — DOCUSATE SODIUM 50 MG/5ML PO LIQD
100.0000 mg | Freq: Two times a day (BID) | ORAL | Status: DC
  Filled 2021-02-08 (×2): qty 10

## 2021-02-08 MED ORDER — CHLORHEXIDINE GLUCONATE CLOTH 2 % EX PADS: 6.0000 | MEDICATED_PAD | Freq: Every day | CUTANEOUS | Status: DC

## 2021-02-08 MED ORDER — PANTOPRAZOLE SODIUM 40 MG PO PACK
40.0000 mg | PACK | Freq: Every day | ORAL | Status: DC
  Filled 2021-02-08: qty 20

## 2021-02-08 MED ORDER — VANCOMYCIN HCL 750 MG/150ML IV SOLN: 750.0000 mg | Freq: Two times a day (BID) | INTRAVENOUS | Status: DC

## 2021-02-08 MED ORDER — INSULIN ASPART 100 UNIT/ML ~~LOC~~ SOLN
0.0000 [IU] | SUBCUTANEOUS | Status: DC
  Administered 2021-02-08: 2 [IU] via SUBCUTANEOUS
  Administered 2021-02-08: 3 [IU] via SUBCUTANEOUS
  Administered 2021-02-08 – 2021-02-09 (×3): 2 [IU] via SUBCUTANEOUS
  Administered 2021-02-09: 21:00:00 5 [IU] via SUBCUTANEOUS
  Administered 2021-02-10 (×2): 2 [IU] via SUBCUTANEOUS
  Administered 2021-02-10: 11 [IU] via SUBCUTANEOUS
  Administered 2021-02-10: 2 [IU] via SUBCUTANEOUS
  Administered 2021-02-10 (×2): 5 [IU] via SUBCUTANEOUS
  Administered 2021-02-11: 2 [IU] via SUBCUTANEOUS
  Administered 2021-02-11: 5 [IU] via SUBCUTANEOUS
  Administered 2021-02-11: 3 [IU] via SUBCUTANEOUS
  Administered 2021-02-11: 8 [IU] via SUBCUTANEOUS
  Administered 2021-02-12: 3 [IU] via SUBCUTANEOUS
  Administered 2021-02-12: 2 [IU] via SUBCUTANEOUS
  Administered 2021-02-12: 3 [IU] via SUBCUTANEOUS
  Administered 2021-02-12 – 2021-02-13 (×2): 2 [IU] via SUBCUTANEOUS
  Administered 2021-02-13: 5 [IU] via SUBCUTANEOUS
  Administered 2021-02-13: 3 [IU] via SUBCUTANEOUS
  Administered 2021-02-13: 2 [IU] via SUBCUTANEOUS
  Administered 2021-02-14: 5 [IU] via SUBCUTANEOUS
  Administered 2021-02-14: 3 [IU] via SUBCUTANEOUS
  Administered 2021-02-14: 8 [IU] via SUBCUTANEOUS
  Administered 2021-02-14: 2 [IU] via SUBCUTANEOUS

## 2021-02-08 MED ORDER — CHLORHEXIDINE GLUCONATE 0.12% ORAL RINSE (MEDLINE KIT)
15.0000 mL | Freq: Two times a day (BID) | OROMUCOSAL | Status: DC
  Administered 2021-02-08 – 2021-02-09 (×2): 15 mL via OROMUCOSAL

## 2021-02-08 MED ORDER — DEXMEDETOMIDINE HCL IN NACL 400 MCG/100ML IV SOLN
0.4000 ug/kg/h | INTRAVENOUS | Status: DC
  Administered 2021-02-08: 0.7 ug/kg/h via INTRAVENOUS
  Administered 2021-02-08: 1.2 ug/kg/h via INTRAVENOUS
  Administered 2021-02-08: 0.8 ug/kg/h via INTRAVENOUS
  Administered 2021-02-09 (×2): 1.2 ug/kg/h via INTRAVENOUS
  Filled 2021-02-08 (×5): qty 100

## 2021-02-08 MED ORDER — DOCUSATE SODIUM 50 MG/5ML PO LIQD: 100.0000 mg | Freq: Two times a day (BID) | ORAL | Status: DC | PRN

## 2021-02-08 MED ORDER — PROPOFOL 1000 MG/100ML IV EMUL
5.0000 ug/kg/min | INTRAVENOUS | Status: DC
  Administered 2021-02-08: 60 ug/kg/min via INTRAVENOUS
  Filled 2021-02-08 (×2): qty 100

## 2021-02-08 MED ORDER — POLYETHYLENE GLYCOL 3350 17 G PO PACK: 17.0000 g | PACK | Freq: Every day | ORAL | Status: DC

## 2021-02-08 MED ORDER — PHENYLEPHRINE HCL-NACL 10-0.9 MG/250ML-% IV SOLN
25.0000 ug/min | INTRAVENOUS | Status: DC
  Administered 2021-02-08: 40 ug/min via INTRAVENOUS
  Administered 2021-02-08: 20 ug/min via INTRAVENOUS
  Administered 2021-02-08: 55 ug/min via INTRAVENOUS
  Filled 2021-02-08 (×4): qty 250

## 2021-02-08 MED ORDER — FENTANYL 2500MCG IN NS 250ML (10MCG/ML) PREMIX INFUSION
0.0000 ug/h | INTRAVENOUS | Status: DC
  Administered 2021-02-08: 125 ug/h via INTRAVENOUS
  Administered 2021-02-08: 50 ug/h via INTRAVENOUS
  Filled 2021-02-08 (×2): qty 250

## 2021-02-08 MED ORDER — PROPOFOL 1000 MG/100ML IV EMUL
INTRAVENOUS | Status: AC
  Administered 2021-02-08: 10 ug/kg/min via INTRAVENOUS
  Filled 2021-02-08: qty 100

## 2021-02-08 MED ORDER — CHLORHEXIDINE GLUCONATE CLOTH 2 % EX PADS
6.0000 | MEDICATED_PAD | Freq: Every day | CUTANEOUS | Status: DC
  Administered 2021-02-08 – 2021-02-14 (×7): 6 via TOPICAL

## 2021-02-08 MED ORDER — HEPARIN SODIUM (PORCINE) 5000 UNIT/ML IJ SOLN
5000.0000 [IU] | Freq: Three times a day (TID) | INTRAMUSCULAR | Status: DC
  Administered 2021-02-08 – 2021-02-09 (×3): 5000 [IU] via SUBCUTANEOUS
  Filled 2021-02-08 (×3): qty 1

## 2021-02-08 MED ORDER — FUROSEMIDE 10 MG/ML IJ SOLN
40.0000 mg | Freq: Once | INTRAMUSCULAR | Status: AC
  Administered 2021-02-08: 40 mg via INTRAVENOUS
  Filled 2021-02-08: qty 4

## 2021-02-08 MED ORDER — POLYETHYLENE GLYCOL 3350 17 G PO PACK: 17.0000 g | PACK | Freq: Every day | ORAL | Status: DC | PRN

## 2021-02-08 MED ORDER — FUROSEMIDE 10 MG/ML IJ SOLN
INTRAMUSCULAR | Status: AC
  Filled 2021-02-08: qty 4

## 2021-02-08 MED ORDER — ROCURONIUM BROMIDE 50 MG/5ML IV SOLN
INTRAVENOUS | Status: AC | PRN
  Administered 2021-02-08: 100 mg via INTRAVENOUS

## 2021-02-08 MED ORDER — SODIUM CHLORIDE 0.9 % IV SOLN
2.0000 g | Freq: Three times a day (TID) | INTRAVENOUS | Status: DC
  Administered 2021-02-08: 2 g via INTRAVENOUS
  Filled 2021-02-08: qty 2

## 2021-02-08 MED ORDER — ORAL CARE MOUTH RINSE
15.0000 mL | OROMUCOSAL | Status: DC
  Administered 2021-02-08 – 2021-02-09 (×5): 15 mL via OROMUCOSAL

## 2021-02-08 MED ORDER — ETOMIDATE 2 MG/ML IV SOLN
INTRAVENOUS | Status: AC | PRN
  Administered 2021-02-08 (×2): 10 mg via INTRAVENOUS

## 2021-02-08 MED ORDER — LABETALOL HCL 5 MG/ML IV SOLN
10.0000 mg | INTRAVENOUS | Status: DC | PRN
  Administered 2021-02-10: 10 mg via INTRAVENOUS
  Administered 2021-02-10: 20 mg via INTRAVENOUS
  Filled 2021-02-08 (×2): qty 4

## 2021-02-08 MED ORDER — IOHEXOL 350 MG/ML SOLN
100.0000 mL | Freq: Once | INTRAVENOUS | Status: AC | PRN
  Administered 2021-02-08: 100 mL via INTRAVENOUS

## 2021-02-08 MED ORDER — METOPROLOL TARTRATE 25 MG PO TABS: 25.0000 mg | ORAL_TABLET | Freq: Two times a day (BID) | ORAL | Status: DC

## 2021-02-08 MED ORDER — NITROGLYCERIN IN D5W 200-5 MCG/ML-% IV SOLN: 0.0000 ug/min | INTRAVENOUS | Status: DC

## 2021-02-08 MED ORDER — PANTOPRAZOLE SODIUM 40 MG IV SOLR: 40.0000 mg | Freq: Every day | INTRAVENOUS | Status: DC

## 2021-02-08 MED ORDER — PROPOFOL 1000 MG/100ML IV EMUL: 5.0000 ug/kg/min | INTRAVENOUS | Status: DC

## 2021-02-08 MED ORDER — PHENYLEPHRINE HCL-NACL 10-0.9 MG/250ML-% IV SOLN
INTRAVENOUS | Status: AC
  Administered 2021-02-08: 25 ug/min via INTRAVENOUS
  Filled 2021-02-08: qty 250

## 2021-02-08 MED ORDER — FENTANYL CITRATE (PF) 100 MCG/2ML IJ SOLN
25.0000 ug | INTRAMUSCULAR | Status: DC | PRN
  Administered 2021-02-08: 25 ug via INTRAVENOUS
  Filled 2021-02-08: qty 2

## 2021-02-08 NOTE — Progress Notes (Signed)
NAME:  Devin Goodman, MRN:  [id number], DOB:  08/23/1956, LOS: 0 ADMISSION DATE:  02/08/2021, CONSULTATION DATE:  02/08/21 REFERRING MD:  Pilar Plate  CHIEF COMPLAINT:  SOB   Brief History   Arvid Marengo is a 65 y.o. male who was admitted 2/10 with acute hypoxic respiratory failure and hypertensive urgency.  He required intubation in ED.   In ED, he was significantly hypertensive with highest SBP of 226.  He was started on propofol and nitro.  CTA chest neg for PE but did demonstrate bilateral GGO's with pulmonary edema and small effusions.   COVID negative and procalcitonin low.    Significant Hospital Events   2/10 > admit.  Consults:  None.  Procedures:  ETT 2/10 >   Significant Diagnostic Tests:  CTA chest 2/10 > no PE.  B/l GGO's, small effusions.  Micro Data:  Blood 2/10 >  Sputum 2/10 >  COVID 2/10 >  Flu 2/10 >   Antimicrobials:  Vanc 2/10 > 2/10 Zosyn 2/10 >  2/10  Interim history/subjective:  Very arousable on fentanyl only this morning and trying to actively remove ETT. In restraints. Propofol stopped due to hypotension.   Objective:  Blood pressure 98/75, pulse 89, temperature 98.8 F (37.1 C), resp. rate 16, height 6' (1.829 m), weight 82.1 kg, SpO2 100 %.    Vent Mode: PRVC FiO2 (%):  [80 %-100 %] 80 % Set Rate:  [16 bmp] 16 bmp Vt Set:  [620 mL] 620 mL PEEP:  [10 cmH20] 10 cmH20 Plateau Pressure:  [22 cmH20] 22 cmH20   Intake/Output Summary (Last 24 hours) at 02/08/2021 0757 Last data filed at 02/08/2021 0700 Gross per 24 hour  Intake 180.12 ml  Output 1100 ml  Net -919.88 ml   Filed Weights   02/08/21 0330 02/08/21 0617  Weight: 83 kg 82.1 kg    Examination: On exam is intubated, arousable and moves purposefully RRR, no mrg Breath sounds clear to auscultation No edema Normal bowel sounds, abdomen soft.    Labs reviewed Troponin is elevated 173   EKG personally reviewed NSR, normal axis, t wave inversion in lateral leads, ST segment  changes noted in V1-V3  CT Chest personally reviewed which shows no PE, diffuse GGOs, bilateral pleural effusions   Assessment & Plan:   Acute hypoxic respiratory failure - multifactorial, presumed acute pulmonary edema in setting hypertensive urgency  - procalcitonin low. cxr more suspicious for pulmonary edema than a focal bacterial pna - d/c abx - Continue full vent support with LTVV.  - Bronchial hygiene. - 40mg  Lasix x 1 this morning   Hypertensive urgency. - off nitro due to hypotension. Off propofol as well. Currently in sedation related circulatory shock requiring neosynephrine. Will titrate down.  - PRN labetalol for above goal. - 40mg  lasix x 1. - Trend troponin, lactate. - resume home metoprolol. Discussed with patient's wife - she notes that he is "non-compliant" and isn't sure if he is taking medications at home or not. May have been holding lisinopril due to cough.   Type 2 DM - SSI. - Hold home Glyburide, Linagliptin.  AKI - 40mg  lasix x 1. - Follow BMP.   Best Practice (evaluated daily):  Diet: NPO. Pain/Anxiety/Delirium protocol (if indicated): Propofol gtt / Fentanyl PRN.  RASS goal -1. VAP protocol (if indicated): In place. DVT prophylaxis: SCD's / Heparin. GI prophylaxis: PPI. Glucose control: SSI. Mobility: Bedrest. Disposition: ICU.  Goals of Care:  Last date of multidisciplinary goals of care discussion: None. Family  and staff present: None. Summary of discussion: None. Follow up goals of care discussion due: 2/17. Code Status:  Full.   The patient is critically ill due to respiratory failure and circulatory shock. I am actively titrating pressors and doing vent management.  They require high complexity decision making for assessment and support, frequent evaluation and titration of therapies, application of advanced monitoring technologies and extensive interpretation of multiple databases.   Critical Care Time devoted to patient care  services described in this note is 45 minutes. This time reflects time of care of this signee Charlott Holler . This critical care time does not reflect separately billable procedures or procedure time, teaching time or supervisory time of PA/NP/Med student/Med Resident etc but could involve care discussion time.  Charlott Holler [facility] 02/08/2021 8:01 AM  Pager: see AMION After hours pager: [phone]  If no response to pager , please call [phone] until 7pm After 7:00 pm call Elink  [phone]     Labs   CBC: Recent Labs  Lab 02/08/21 0115 02/08/21 0318 02/08/21 0326 02/08/21 0636  WBC 7.5  --   --   --   HGB 17.4* 18.0* 18.4* 16.3  HCT 56.6* 53.0* 54.0* 48.0  MCV 83.7  --   --   --   PLT 319  --   --   --    Basic Metabolic Panel: Recent Labs  Lab 02/08/21 0115 02/08/21 0308 02/08/21 0318 02/08/21 0326 02/08/21 0636  NA 138 138 136 138 140  K 5.1 3.8 4.1 3.8 4.2  CL 106 101  --   --   --   CO2 18* 23  --   --   --   GLUCOSE 211* 279*  --   --   --   BUN 23 23  --   --   --   CREATININE 1.31* 1.29*  --   --   --   CALCIUM 9.1 9.2  --   --   --   MG  --  2.2  --   --   --   PHOS  --  5.6*  --   --   --    GFR: Estimated Creatinine Clearance: 61.8 mL/min (A) (by C-G formula based on SCr of 1.29 mg/dL (H)). Recent Labs  Lab 02/08/21 0115 02/08/21 0308  PROCALCITON  --  <0.10  WBC 7.5  --   LATICACIDVEN 4.1* 3.0*   Liver Function Tests: Recent Labs  Lab 02/08/21 0115  AST 47*  ALT 51*  ALKPHOS 76  BILITOT 1.0  PROT 7.4  ALBUMIN 4.0   No results for input(s): LIPASE, AMYLASE in the last 168 hours. No results for input(s): AMMONIA in the last 168 hours. ABG    Component Value Date/Time   PHART 7.362 02/08/2021 0636   PCO2ART 41.9 02/08/2021 0636   PO2ART 169 (H) 02/08/2021 0636   HCO3 23.8 02/08/2021 0636   TCO2 25 02/08/2021 0636   ACIDBASEDEF 2.0 02/08/2021 0636   O2SAT 99.0 02/08/2021 0636     Coagulation Profile: Recent Labs  Lab 02/08/21 0308  INR 1.0   Cardiac Enzymes: No results for input(s): CKTOTAL, CKMB, CKMBINDEX, TROPONINI in the last 168 hours. HbA1C: Hemoglobin A1C  Date/Time Value Ref Range Status  01/08/2021 03:29 PM 6.0 (A) 4.0 - 5.6 % Final  07/05/2020 08:14 AM 6.8 (A) 4.0 - 5.6 % Final   Hgb A1c MFr Bld  Date/Time Value Ref Range Status  03/13/2020 02:36 PM 7.4 (H) 4.8 - 5.6 % Final    Comment:    (NOTE) Pre diabetes:          5.7%-6.4% Diabetes:              >6.4% Glycemic control for   <7.0% adults with diabetes    CBG: No results for input(s): GLUCAP in the last 168 hours.

## 2021-02-08 NOTE — ED Provider Notes (Signed)
[facility]-EMERGENCY DEPT [facility] Emergency Department Provider Note MRN:  [id number]  Arrival date & time: 02/08/21     Chief Complaint   Shortness of Breath   History of Present Illness   Devin Goodman is a 65 y.o. year-old male with a history of hypertension, diabetes presenting to the ED with chief complaint of shortness of breath.  Acute onset shortness of breath this evening, found to be in the 70s on room air with EMS, BiPAP started in route.  Patient is breathless, agitated/combative, begging for help.  Denies chest pain.  I was unable to obtain an accurate HPI, PMH, or ROS due to the patient's hypoxic respiratory failure.  Level 5 caveat.  Review of Systems  Positive for hypoxic respiratory failure.  Patient's Health History    Past Medical History:  Diagnosis Date  . Diabetes mellitus without complication (HCC)   . Hyperlipidemia   . Hypertension     Past Surgical History:  Procedure Laterality Date  . INGUINAL HERNIA REPAIR Left     Family History  Problem Relation Age of Onset  . Cancer Mother     Social History   Socioeconomic History  . Marital status: Married    Spouse name: Not on file  . Number of children: Not on file  . Years of education: Not on file  . Highest education level: Not on file  Occupational History  . Not on file  Tobacco Use  . Smoking status: Never Smoker  . Smokeless tobacco: Never Used  Vaping Use  . Vaping Use: Never used  Substance and Sexual Activity  . Alcohol use: Yes    Comment: occ  . Drug use: Never  . Sexual activity: Not on file  Other Topics Concern  . Not on file  Social History Narrative  . Not on file   Social Determinants of Health   Financial Resource Strain: Not on file  Food Insecurity: Not on file  Transportation Needs: Not on file  Physical Activity: Not on file  Stress: Not on file  Social Connections: Not on file  Intimate Partner Violence: Not on file     Physical Exam   Vitals:    02/08/21 0203 02/08/21 0215  BP: (!) 193/143 (!) 168/133  Pulse: (!) 114 (!) 113  Resp: 16 16  Temp:  97.7 F (36.5 C)  SpO2: 99% 96%    CONSTITUTIONAL: Ill-appearing, in severe respiratory distress NEURO: Awake, agitated, moving all extremities EYES:  eyes equal and reactive ENT/NECK:  no LAD, no JVD CARDIO: Tachycardic rate, well-perfused, normal S1 and S2 PULM: Diffuse rhonchi GI/GU:  normal bowel sounds, non-distended, non-tender MSK/SPINE:  No gross deformities, no edema SKIN:  no rash, atraumatic PSYCH:  Appropriate speech and behavior  *Additional and/or pertinent findings included in MDM below  Diagnostic and Interventional Summary    EKG Interpretation  Date/Time:  Thursday February 08 2021 01:15:56 EST Ventricular Rate:  140 PR Interval:    QRS Duration: 98 QT Interval:  288 QTC Calculation: 440 R Axis:   67 Text Interpretation: Sinus tachycardia Multiform ventricular premature complexes LAE, consider biatrial enlargement LVH with secondary repolarization abnormality Anterior infarct, acute (LAD) Baseline wander in lead(s) V4 Confirmed by Kennis Carina [phone]) on 02/08/2021 1:45:37 AM      Labs Reviewed  CBC - Abnormal; Notable for the following components:      Result Value   RBC 6.76 (*)    Hemoglobin 17.4 (*)    HCT 56.6 (*)  MCH 25.7 (*)    All other components within normal limits  COMPREHENSIVE METABOLIC PANEL - Abnormal; Notable for the following components:   CO2 18 (*)    Glucose, Bld 211 (*)    Creatinine, Ser 1.31 (*)    AST 47 (*)    ALT 51 (*)    All other components within normal limits  LACTIC ACID, PLASMA - Abnormal; Notable for the following components:   Lactic Acid, Venous 4.1 (*)    All other components within normal limits  TROPONIN I (HIGH SENSITIVITY) - Abnormal; Notable for the following components:   Troponin I (High Sensitivity) 26 (*)    All other components within normal limits  RESP PANEL BY RT-PCR (FLU A&B, COVID)  ARPGX2  CULTURE, BLOOD (ROUTINE X 2)  CULTURE, BLOOD (ROUTINE X 2)  URINE CULTURE  CULTURE, RESPIRATORY  BRAIN NATRIURETIC PEPTIDE  PROTIME-INR  APTT  BLOOD GAS, ARTERIAL  LACTIC ACID, PLASMA  LACTIC ACID, PLASMA  HIV ANTIBODY (ROUTINE TESTING W REFLEX)  CBC  BASIC METABOLIC PANEL  BLOOD GAS, ARTERIAL  MAGNESIUM  PHOSPHORUS  PROCALCITONIN    DG Chest Portable 1 View  Final Result    CT ANGIO CHEST PE W OR WO CONTRAST  Final Result    DG Chest Portable 1 View  Final Result    DG Chest Port 1 View    (Results Pending)    Medications  nitroGLYCERIN 50 mg in dextrose 5 % 250 mL (0.2 mg/mL) infusion (80 mcg/min Intravenous Rate/Dose Change 02/08/21 0223)  propofol (DIPRIVAN) 1000 MG/100ML infusion (20 mcg/kg/min Intravenous Rate/Dose Change 02/08/21 0136)  furosemide (LASIX) injection 40 mg (has no administration in time range)  labetalol (NORMODYNE) injection 10-20 mg (has no administration in time range)  heparin injection 5,000 Units (has no administration in time range)  pantoprazole (PROTONIX) injection 40 mg (has no administration in time range)  docusate sodium (COLACE) capsule 100 mg (has no administration in time range)  polyethylene glycol (MIRALAX / GLYCOLAX) packet 17 g (has no administration in time range)  docusate (COLACE) 50 MG/5ML liquid 100 mg (has no administration in time range)  polyethylene glycol (MIRALAX / GLYCOLAX) packet 17 g (has no administration in time range)  fentaNYL (SUBLIMAZE) injection 25 mcg (has no administration in time range)  insulin aspart (novoLOG) injection 0-15 Units (has no administration in time range)  etomidate (AMIDATE) injection (10 mg Intravenous Given 02/08/21 0104)  rocuronium (ZEMURON) injection (100 mg Intravenous Given 02/08/21 0106)  iohexol (OMNIPAQUE) 350 MG/ML injection 100 mL (100 mLs Intravenous Contrast Given 02/08/21 0141)     Procedures  /  Critical Care .Critical Care Performed by: Sabas Sous,  MD Authorized by: Sabas Sous, MD   Critical care provider statement:    Critical care time (minutes):  80   Critical care was necessary to treat or prevent imminent or life-threatening deterioration of the following conditions:  Respiratory failure   Critical care was time spent personally by me on the following activities:  Discussions with consultants, evaluation of patient's response to treatment, examination of patient, ordering and performing treatments and interventions, ordering and review of laboratory studies, ordering and review of radiographic studies, pulse oximetry, re-evaluation of patient's condition, obtaining history from patient or surrogate and review of old charts Procedure Name: Intubation Date/Time: 02/08/2021 1:46 AM Performed by: Sabas Sous, MD Pre-anesthesia Checklist: Patient identified, Patient being monitored, Emergency Drugs available, Timeout performed and Suction available Oxygen Delivery Method: Non-rebreather mask Preoxygenation: Pre-oxygenation with 100%  oxygen Induction Type: Rapid sequence Ventilation: Mask ventilation without difficulty Laryngoscope Size: Glidescope and 4 Grade View: Grade I Tube size: 7.5 mm Number of attempts: 2 Airway Equipment and Method: Rigid stylet Placement Confirmation: ETT inserted through vocal cords under direct vision,  CO2 detector and Breath sounds checked- equal and bilateral Secured at: 25 cm Tube secured with: ETT holder Comments: Patient successfully intubated on first attempt with size 3 glide scope.  RSI using 20 of etomidate and 100 of rocuronium.  It was then noticed that patient had a significant cuff leak and the ventilator was not functioning properly.  Began to have some desaturations.  Decision made to remove ET tube and replace.  Replaced with another 7.5 tube this time using size 4 glide scope which provided a better view.       ED Course and Medical Decision Making  I have reviewed the triage  vital signs, the nursing notes, and pertinent available records from the EMR.  Listed above are laboratory and imaging tests that I personally ordered, reviewed, and interpreted and then considered in my medical decision making (see below for details).  Initial concern for acute pulmonary edema given the severe hypertension, diffuse rhonchi, significant respiratory distress.  Peculiar given that patient has no history of CHF.  And so also considering MI with valvular failure, the patient denies any chest pain.  Patient arrives gasping for breath, grabbing and kicking at staff, not tolerating BiPAP.  Decision made to sedate patient using 10 mg etomidate, which facilitated bag-valve-mask ventilation.  During patient's intolerance of BiPAP he managed to decrease his oxygen saturation into the 50s and 60s.  We were able to improve this to the 70s and 80s with bag-valve-mask.  At that point that was is optimized as I felt the patient could get.  An additional 10 mg of etomidate was given as well as 100 mg rocuronium.  Intubated as described above.  At the same time patient being started on nitro drip, propofol for sedation.  Patient's oxygen saturation improving to the 90s after intubation.  Also considering pulmonary embolism, rapid CTA obtained and I see no obvious saddle or large PE.  EKG is demonstrating some ST elevations but I do not feel it meets criteria for STEMI, will consult cardiology fellow to see with a think. Clinical Course as of 02/08/21 0231  Thu Feb 08, 2021  0158 EKG reviewed by Dr. Scarlette Calico of [facility] cardiology at bedside, favoring benign early repole.  She agrees that this does not meet STEMI criteria, recommending trending of troponins. [MB]    Clinical Course User Index [MB] Sabas Sous, MD     Patient has reached a level of stability on the ventilator, heart rate down to 110, still hypertensive in the 190s systolic despite nitro drip and propofol drip.  Admitted to intensivist for  further care.  Elmer Sow. Pilar Plate, MD [facility] Emergency Medicine [facility] [email or web address]  Final Clinical Impressions(s) / ED Diagnoses     ICD-10-CM   1. Acute respiratory failure with hypoxia (HCC)  J96.01   2. Acute pulmonary edema (HCC)  J81.0     ED Discharge Orders    None       Discharge Instructions Discussed with and Provided to Patient:   Discharge Instructions   None       Sabas Sous, MD 02/08/21 [phone]

## 2021-02-08 NOTE — Progress Notes (Signed)
Pharmacy Antibiotic Note  Devin Goodman is a 65 y.o. male admitted on 02/08/2021 with pneumonia.  Pharmacy has been consulted for Vancomycin/Zosyn dosing. WBC WNL. Mild bump in Scr. Acute respiratory failure in the ED requiring intubation.   Plan: Vancomycin 750 mg IV q12h >>Estimated AUC: 430 Cefepime 2g IV q8h Trend WBC, temp, renal function  F/U infectious work-up Drug levels as indicated   Height: 6' (182.9 cm) IBW/kg (Calculated) : 77.6  Temp (24hrs), Avg:97.6 F (36.4 C), Min:95.2 F (35.1 C), Max:98.4 F (36.9 C)  Recent Labs  Lab 02/08/21 0115  WBC 7.5  CREATININE 1.31*  LATICACIDVEN 4.1*    CrCl cannot be calculated (Unknown ideal weight.).    Allergies  Allergen Reactions  . Penicillins     vomiting   Abran Duke, PharmD, BCPS Clinical Pharmacist Phone: [phone]

## 2021-02-08 NOTE — Progress Notes (Signed)
  Echocardiogram 2D Echocardiogram has been performed.  Gerda Diss 02/08/2021, 4:06 PM

## 2021-02-08 NOTE — ED Triage Notes (Signed)
Pt BIB [facility] from home, c/o sudden onset shortness of breath. On EMS arrival, pt agitated, diaphoretic and hypertensive. Initial SpO2 70s on room air, improved to 90 on CPAP. Pt denies chest pain.

## 2021-02-08 NOTE — Progress Notes (Signed)
Patient was initially intubated and placed on ventilator. RT and RN noticed significant air coming from ETT while ventilator was constantly alarming. MD made aware. Patient was re-intubated with a bigger size tube. The issue was resolved with a bigger ETT tube and tube being advanced to 30 cm at the lips. Patient tolerated well and is currently stable on ventilator.

## 2021-02-08 NOTE — ED Notes (Signed)
Pt agitated, attempting to climb out of bed, continuously stating "I can't breathe".

## 2021-02-08 NOTE — ED Notes (Signed)
Wife is in Consult A.

## 2021-02-08 NOTE — Progress Notes (Signed)
eLink Physician-Brief Progress Note Patient Name: Devin Goodman DOB: 02/16/1956 MRN: [id number]   Date of Service  02/08/2021  HPI/Events of Note  Hypotension - BP = 66/45 with MAP = 54. CXR review: ETT 2.5 cm above carina which is satisfactory. ABG = 7.36/42/169/23.8 which is also acceptable.   eICU Interventions  Plan: 1. Fentanyl IV infusion. Titrate to RASS = 0 to -1. 2. Wean Propofol IV infusion off.  3. Phenylephrine IV infusion via PIV. Titrate to MAP >= 65.      Intervention Category Major Interventions: Hypotension - evaluation and management  Terilyn Sano Dennard Nip 02/08/2021, 6:47 AM

## 2021-02-08 NOTE — Progress Notes (Signed)
Patient transported to 2M16 without complications.

## 2021-02-08 NOTE — ED Notes (Signed)
Provider notified of low BP. Nitro paused at this time. Propofol titrated down to . Pt in NAD. Will await further orders.

## 2021-02-08 NOTE — ED Notes (Signed)
Attempted to call report; RN unable to take report at this time.

## 2021-02-08 NOTE — Progress Notes (Signed)
CRITICAL VALUE ALERT  Critical Value:  Troponin 173  Date & Time Notied:  02/08/21 @0952   Provider Notified: Dr.  Orders Received/Actions taken: No orders received at this time.

## 2021-02-08 NOTE — H&P (Signed)
NAME:  Devin Goodman, MRN:  [id number], DOB:  May 30, 1956, LOS: 0 ADMISSION DATE:  02/08/2021, CONSULTATION DATE:  02/08/21 REFERRING MD:  Pilar Plate  CHIEF COMPLAINT:  SOB   Brief History   Devin Goodman is a 65 y.o. male who was admitted 2/10 with acute hypoxic respiratory failure and hypertensive urgency.  He required intubation in ED.  CTA with diffuse GGO's.  COVID pending.  History of present illness   Pt is encephelopathic; therefore, this HPI is obtained from chart review. Devin Goodman is a 65 y.o. male who has a PMH including but not limited to DM, HTN, HLD.  He presented to [facility] ED 2/10 with significant dyspnea, agitation, diaphoresis.  He was trialed on BiPAP; however, sats dropped into 50's and 60's.  He was subsequently intubated.  In ED, he was significantly hypertensive with highest SBP of 226.  He was started on propofol and nitro.  CTA chest neg for PE but did demonstrate bilateral GGO's with pulmonary edema and small effusions.  Some concern for PNA as well as COVID PNA.  COVID PCR pending.  Past Medical History  has Type 2 diabetes mellitus with hyperglycemia (HCC); Dyslipidemia; Essential hypertension; and Hypertensive urgency on their problem list.  Significant Hospital Events   2/10 > admit.  Consults:  None.  Procedures:  ETT 2/10 >   Significant Diagnostic Tests:  CTA chest 2/10 > no PE.  B/l GGO's, small effusions.  Micro Data:  Blood 2/10 >  Sputum 2/10 >  COVID 2/10 >  Flu 2/10 >   Antimicrobials:  Vanc 2/10 >  Zosyn 2/10 >    Interim history/subjective:  Sedated, comfortable.  Objective:  Blood pressure (!) 168/133, pulse (!) 113, temperature 97.7 F (36.5 C), resp. rate 16, height 6' (1.829 m), SpO2 96 %.    Vent Mode: PRVC FiO2 (%):  [100 %] 100 % Set Rate:  [16 bmp] 16 bmp Vt Set:  [620 mL] 620 mL PEEP:  [10 cmH20] 10 cmH20 Plateau Pressure:  [22 cmH20] 22 cmH20  No intake or output data in the 24 hours ending 02/08/21 0226 There were no  vitals filed for this visit.  Examination: General: Adult male, in NAD. Neuro: Sedated, minimally responsive. HEENT: [place]/AT. Sclerae anicteric.  ETT in place. Cardiovascular: RRR, no M/R/G.  Lungs: Respirations even and unlabored.  Rhonchi bilaterally. Abdomen: BS x 4, soft, NT/ND.  Musculoskeletal: No gross deformities, no edema.  Skin: Intact, warm, no rashes.  Assessment & Plan:   Acute hypoxic respiratory failure - multifactorial, presumed acute pulmonary edema in setting hypertensive urgency but also can not rule out bacterial PNA or COVID PNA given diffuse GGO's. - Continue full vent support. - SBT in AM. - Bronchial hygiene. - Empiric abx with vanc / zosyn for now. - Follow cultures as well as COVID PCR. - 40mg  Lasix x 1. - Follow CXR.  Hypertensive urgency. - Continue Nitro as needed, goal SBP 170 initially. - PRN labetalol for above goal. - 40mg  lasix x 1. - Trend troponin, lactate. - Hold home Toprol-XL.  Hx DM. - SSI. - Hold home Glyburide, Linagliptin.  AKI. - 40mg  lasix x 1. - Follow BMP.   Best Practice (evaluated daily):  Diet: NPO. Pain/Anxiety/Delirium protocol (if indicated): Propofol gtt / Fentanyl PRN.  RASS goal -1. VAP protocol (if indicated): In place. DVT prophylaxis: SCD's / Heparin. GI prophylaxis: PPI. Glucose control: SSI. Mobility: Bedrest. Disposition: ICU.  Goals of Care:  Last date of multidisciplinary goals of care discussion:  None. Family and staff present: None. Summary of discussion: None. Follow up goals of care discussion due: 2/17. Code Status:  Full.  Labs   CBC: Recent Labs  Lab 02/08/21 0115  WBC 7.5  HGB 17.4*  HCT 56.6*  MCV 83.7  PLT 319   Basic Metabolic Panel: Recent Labs  Lab 02/08/21 0115  NA 138  K 5.1  CL 106  CO2 18*  GLUCOSE 211*  BUN 23  CREATININE 1.31*  CALCIUM 9.1   GFR: CrCl cannot be calculated (Unknown ideal weight.). Recent Labs  Lab 02/08/21 0115  WBC 7.5  LATICACIDVEN  4.1*   Liver Function Tests: Recent Labs  Lab 02/08/21 0115  AST 47*  ALT 51*  ALKPHOS 76  BILITOT 1.0  PROT 7.4  ALBUMIN 4.0   No results for input(s): LIPASE, AMYLASE in the last 168 hours. No results for input(s): AMMONIA in the last 168 hours. ABG No results found for: PHART, PCO2ART, PO2ART, HCO3, TCO2, ACIDBASEDEF, O2SAT  Coagulation Profile: No results for input(s): INR, PROTIME in the last 168 hours. Cardiac Enzymes: No results for input(s): CKTOTAL, CKMB, CKMBINDEX, TROPONINI in the last 168 hours. HbA1C: Hemoglobin A1C  Date/Time Value Ref Range Status  01/08/2021 03:29 PM 6.0 (A) 4.0 - 5.6 % Final  07/05/2020 08:14 AM 6.8 (A) 4.0 - 5.6 % Final   Hgb A1c MFr Bld  Date/Time Value Ref Range Status  03/13/2020 02:36 PM 7.4 (H) 4.8 - 5.6 % Final    Comment:    (NOTE) Pre diabetes:          5.7%-6.4% Diabetes:              >6.4% Glycemic control for   <7.0% adults with diabetes    CBG: No results for input(s): GLUCAP in the last 168 hours.  Review of Systems:   Unable to obtain as pt is encephalopathic.  Past medical history  He,  has a past medical history of Diabetes mellitus without complication (HCC), Hyperlipidemia, and Hypertension.   Surgical History    Past Surgical History:  Procedure Laterality Date  . INGUINAL HERNIA REPAIR Left      Social History   reports that he has never smoked. He has never used smokeless tobacco. He reports current alcohol use. He reports that he does not use drugs.   Family history   His family history includes Cancer in his mother.   Allergies Allergies  Allergen Reactions  . Penicillins     vomiting     Home meds  Prior to Admission medications   Medication Sig Start Date End Date Taking? Authorizing Provider  atorvastatin (LIPITOR) 20 MG tablet Take 1 tablet (20 mg total) by mouth daily. 07/04/20   Ardith Dark, MD  glyBURIDE (DIABETA) 5 MG tablet Take 1 tablet (5 mg total) by mouth 2 (two) times daily  with a meal. 07/04/20   Ardith Dark, MD  linagliptin (TRADJENTA) 5 MG TABS tablet Take 1 tablet (5 mg total) by mouth daily. 03/13/20   Georgetta Haber, NP  metoprolol succinate (TOPROL XL) 25 MG 24 hr tablet Take 1 tablet (25 mg total) by mouth daily. 07/04/20   Ardith Dark, MD    Critical care time: 35 min.   Rutherford Guys, [place] [facility] For pager details, please see AMION 02/08/2021, 2:34 AM

## 2021-02-09 ENCOUNTER — Inpatient Hospital Stay: Payer: Self-pay

## 2021-02-09 ENCOUNTER — Inpatient Hospital Stay (HOSPITAL_COMMUNITY): Payer: Medicare Other

## 2021-02-09 DIAGNOSIS — I5021 Acute systolic (congestive) heart failure: Secondary | ICD-10-CM | POA: Diagnosis not present

## 2021-02-09 DIAGNOSIS — R9389 Abnormal findings on diagnostic imaging of other specified body structures: Secondary | ICD-10-CM | POA: Diagnosis not present

## 2021-02-09 DIAGNOSIS — J9601 Acute respiratory failure with hypoxia: Secondary | ICD-10-CM | POA: Diagnosis not present

## 2021-02-09 DIAGNOSIS — E1165 Type 2 diabetes mellitus with hyperglycemia: Secondary | ICD-10-CM

## 2021-02-09 DIAGNOSIS — I5020 Unspecified systolic (congestive) heart failure: Secondary | ICD-10-CM

## 2021-02-09 DIAGNOSIS — N179 Acute kidney failure, unspecified: Secondary | ICD-10-CM | POA: Diagnosis not present

## 2021-02-09 DIAGNOSIS — J810 Acute pulmonary edema: Secondary | ICD-10-CM | POA: Diagnosis not present

## 2021-02-09 LAB — CBC
HCT: 43.9 % (ref 39.0–52.0)
Hemoglobin: 14.8 g/dL (ref 13.0–17.0)
MCH: 27 pg (ref 26.0–34.0)
MCHC: 33.7 g/dL (ref 30.0–36.0)
MCV: 80.1 fL (ref 80.0–100.0)
Platelets: 187 10*3/uL (ref 150–400)
RBC: 5.48 MIL/uL (ref 4.22–5.81)
RDW: 14.4 % (ref 11.5–15.5)
WBC: 8.3 10*3/uL (ref 4.0–10.5)
nRBC: 0 % (ref 0.0–0.2)

## 2021-02-09 LAB — BASIC METABOLIC PANEL
Anion gap: 18 — ABNORMAL HIGH (ref 5–15)
BUN: 27 mg/dL — ABNORMAL HIGH (ref 8–23)
CO2: 18 mmol/L — ABNORMAL LOW (ref 22–32)
Calcium: 9 mg/dL (ref 8.9–10.3)
Chloride: 107 mmol/L (ref 98–111)
Creatinine, Ser: 1.28 mg/dL — ABNORMAL HIGH (ref 0.61–1.24)
GFR, Estimated: >60 mL/min (ref >60)
Glucose, Bld: 129 mg/dL — ABNORMAL HIGH (ref 70–99)
Potassium: 3.9 mmol/L (ref 3.5–5.1)
Sodium: 143 mmol/L (ref 135–145)

## 2021-02-09 LAB — COOXEMETRY PANEL
Carboxyhemoglobin: 0.9 % (ref 0.5–1.5)
Methemoglobin: 0.8 % (ref 0.0–1.5)
O2 Saturation: 71.2 %
Total hemoglobin: 14.3 g/dL (ref 12.0–16.0)

## 2021-02-09 LAB — GLUCOSE, CAPILLARY
Glucose-Capillary: 121 mg/dL — ABNORMAL HIGH (ref 70–99)
Glucose-Capillary: 113 mg/dL — ABNORMAL HIGH (ref 70–99)
Glucose-Capillary: 134 mg/dL — ABNORMAL HIGH (ref 70–99)
Glucose-Capillary: 100 mg/dL — ABNORMAL HIGH (ref 70–99)
Glucose-Capillary: 230 mg/dL — ABNORMAL HIGH (ref 70–99)
Glucose-Capillary: 117 mg/dL — ABNORMAL HIGH (ref 70–99)

## 2021-02-09 LAB — PROCALCITONIN: Procalcitonin: 0.6 ng/mL

## 2021-02-09 LAB — TRIGLYCERIDES: Triglycerides: 63 mg/dL (ref <150)

## 2021-02-09 MED ORDER — SODIUM CHLORIDE 0.9% FLUSH: 10.0000 mL | INTRAVENOUS | Status: DC | PRN

## 2021-02-09 MED ORDER — FUROSEMIDE 10 MG/ML IJ SOLN
40.0000 mg | Freq: Two times a day (BID) | INTRAMUSCULAR | Status: DC
  Administered 2021-02-09 – 2021-02-10 (×2): 40 mg via INTRAVENOUS
  Filled 2021-02-09 (×2): qty 4

## 2021-02-09 MED ORDER — POLYETHYLENE GLYCOL 3350 17 G PO PACK
17.0000 g | PACK | Freq: Every day | ORAL | Status: DC
  Administered 2021-02-09: 17 g via ORAL
  Filled 2021-02-09: qty 1

## 2021-02-09 MED ORDER — DIGOXIN 125 MCG PO TABS
0.1250 mg | ORAL_TABLET | Freq: Every day | ORAL | Status: DC
  Administered 2021-02-09 – 2021-02-14 (×7): 0.125 mg via ORAL
  Filled 2021-02-09 (×6): qty 1

## 2021-02-09 MED ORDER — PANTOPRAZOLE SODIUM 40 MG PO TBEC: 40.0000 mg | DELAYED_RELEASE_TABLET | Freq: Every day | ORAL | Status: DC

## 2021-02-09 MED ORDER — DOCUSATE SODIUM 100 MG PO CAPS
100.0000 mg | ORAL_CAPSULE | Freq: Two times a day (BID) | ORAL | Status: DC
  Administered 2021-02-09 – 2021-02-14 (×7): 100 mg via ORAL
  Filled 2021-02-09 (×8): qty 1

## 2021-02-09 MED ORDER — DOCUSATE SODIUM 100 MG PO CAPS: 100.0000 mg | ORAL_CAPSULE | Freq: Two times a day (BID) | ORAL | Status: DC | PRN

## 2021-02-09 MED ORDER — ENOXAPARIN SODIUM 40 MG/0.4ML ~~LOC~~ SOLN
40.0000 mg | SUBCUTANEOUS | Status: DC
  Administered 2021-02-09 – 2021-02-11 (×3): 40 mg via SUBCUTANEOUS
  Filled 2021-02-09 (×3): qty 0.4

## 2021-02-09 MED ORDER — SODIUM CHLORIDE 0.9% FLUSH
10.0000 mL | Freq: Two times a day (BID) | INTRAVENOUS | Status: DC
  Administered 2021-02-09 – 2021-02-14 (×8): 10 mL

## 2021-02-09 MED ORDER — POLYETHYLENE GLYCOL 3350 17 G PO PACK: 17.0000 g | PACK | Freq: Every day | ORAL | Status: DC | PRN

## 2021-02-09 MED ORDER — MILRINONE LACTATE IN DEXTROSE 20-5 MG/100ML-% IV SOLN
0.2500 ug/kg/min | INTRAVENOUS | Status: DC
  Administered 2021-02-09 – 2021-02-10 (×3): 0.25 ug/kg/min via INTRAVENOUS
  Filled 2021-02-09 (×3): qty 100

## 2021-02-09 MED ORDER — ORAL CARE MOUTH RINSE
15.0000 mL | Freq: Two times a day (BID) | OROMUCOSAL | Status: DC
  Administered 2021-02-09 – 2021-02-14 (×9): 15 mL via OROMUCOSAL

## 2021-02-09 NOTE — Progress Notes (Signed)
Pt extubated to Bipap.

## 2021-02-09 NOTE — Progress Notes (Signed)
eLink Physician-Brief Progress Note Patient Name: Devin Goodman DOB: [birth year] MRN: [id number]   Date of Service  02/09/2021  HPI/Events of Note  Patient with sinus tachycardia with PAC's on 12 lead EKG, blood pressure is stable. Patient is admitted with cardiomyopathy with systolic heart failure.  eICU Interventions  Continue to monitor patient.        Thomasene Lot Ogan 02/09/2021, 8:21 PM

## 2021-02-09 NOTE — Progress Notes (Signed)
Called and notified Elink that only procalcitonin and triglycerides were ordered for 5am labs. I recommend repeating the troponin and lactic acid because the last values were critical and there was no order to follow up. MD order BMP and CBC.

## 2021-02-09 NOTE — Progress Notes (Signed)
NAME:  Devin Goodman, MRN:  [id number], DOB:  1956/07/24, LOS: 1 ADMISSION DATE:  02/08/2021, CONSULTATION DATE:  02/08/21 REFERRING MD:  Pilar Plate  CHIEF COMPLAINT:  SOB   Brief History   Devin Goodman is a 66 y.o. male who was admitted 2/10 with acute hypoxic respiratory failure and hypertensive urgency.  He required intubation in ED.   In ED, he was significantly hypertensive with highest SBP of 226.  He was started on propofol and nitro.  CTA chest neg for PE but did demonstrate bilateral GGO's with pulmonary edema and small effusions.   COVID negative and procalcitonin low.    Significant Hospital Events   2/10 > admit.  Consults:  None.  Procedures:  ETT 2/10 >   Significant Diagnostic Tests:  CTA chest 2/10 > no PE.  B/l GGO's, small effusions.  Micro Data:  Blood 2/10 >  Sputum 2/10 >  COVID 2/10 >  Flu 2/10 >   Antimicrobials:  Vanc 2/10 > 2/10 Zosyn 2/10 >  2/10  Interim history/subjective:  This morning awake and calm. On an SBT. Following command and passing.   Objective:  Blood pressure 90/65, pulse 66, temperature (!) 96.5 F (35.8 C), temperature source Axillary, resp. rate 17, height 6' (1.829 m), weight 80.3 kg, SpO2 100 %.    Vent Mode: PSV FiO2 (%):  [40 %] 40 % Set Rate:  [16 bmp] 16 bmp Vt Set:  [620 mL] 620 mL PEEP:  [5 cmH20-10 cmH20] 5 cmH20 Pressure Support:  [5 cmH20] 5 cmH20 Plateau Pressure:  [20 cmH20-22 cmH20] 22 cmH20   Intake/Output Summary (Last 24 hours) at 02/09/2021 1025 Last data filed at 02/09/2021 0700 Gross per 24 hour  Intake 1640.26 ml  Output 2375 ml  Net -734.74 ml   Filed Weights   02/08/21 0330 02/08/21 0617 02/09/21 0015  Weight: 83 kg 82.1 kg 80.3 kg    Examination: On exam he is intubated, calm, following commands Lungs are clear to auscultation bilaterally There is no peripheral edema His heart sounds are regular rate and rhythm, no murmurs rubs or gallops   Labs reviewed Sodium is 143, creatinine 1.28,  hemoglobin 14.8, blood sugars controlled most recently 113   Assessment & Plan:   Acute hypoxic respiratory failure -likely in the setting of acute pulmonary edema - procalcitonin low. cxr more suspicious for pulmonary edema than a focal bacterial pna - He is passing his SBT this morning.  We will extubate to BiPAP  New onset heart failure reduced ejection fraction Echocardiogram shows EF of 20% He did have some troponin elevation as well.  Concern for ischemic insult which could have caused his reduction in ejection fraction Cardiology consulted If he needs a vasoactive medication will prefer norepinephrine  Hypertensive urgency. -Resolved.  He was briefly in circulatory shock yesterday, now I think this was probably cardiogenic.  He is now off all vasoactive agents.  Blood pressure still a little on the low side now.  We will hold all agents until he can have a cardiology evaluation.  May need some more Lasix as well.  Wife notes noncompliance to medications at home.  Type 2 DM - SSI. - Hold home Glyburide, Linagliptin.  AKI -Creatinine stabilized around 1.3.  This may be at baseline and could be CKD. - Follow BMP.   Best Practice (evaluated daily):  Diet: NPO.  Can advance diet once extubated Pain/Anxiety/Delirium protocol (if indicated):  VAP protocol (if indicated): In place. DVT prophylaxis: SCD's / Heparin. GI  prophylaxis: PPI. Glucose control: SSI. Mobility: Bedrest. Disposition: ICU.  Goals of Care:  Last date of multidisciplinary goals of care discussion: None. Family and staff present: None. Summary of discussion: None. Follow up goals of care discussion due: 2/17. Code Status:  Full.   The patient is critically ill due to respiratory failure.  They require high complexity decision making for assessment and support, frequent evaluation and titration of therapies, application of advanced monitoring technologies and extensive interpretation of multiple databases.    Critical Care Time devoted to patient care services described in this note is 35 minutes. This time reflects time of care of this signee Charlott Holler . This critical care time does not reflect separately billable procedures or procedure time, teaching time or supervisory time of PA/NP/Med student/Med Resident etc but could involve care discussion time.  Charlott Holler [facility] 02/09/2021 10:25 AM  Pager: see AMION After hours pager: [phone]  If no response to pager , please call [phone] until 7pm After 7:00 pm call Elink  [phone]      Labs   CBC: Recent Labs  Lab 02/08/21 0115 02/08/21 0318 02/08/21 0326 02/08/21 0636 02/08/21 0828 02/09/21 0551  WBC 7.5  --   --   --  14.9* 8.3  HGB 17.4* 18.0* 18.4* 16.3 16.0 14.8  HCT 56.6* 53.0* 54.0* 48.0 50.1 43.9  MCV 83.7  --   --   --  82.3 80.1  PLT 319  --   --   --  280 187   Basic Metabolic Panel: Recent Labs  Lab 02/08/21 0115 02/08/21 0308 02/08/21 0318 02/08/21 0326 02/08/21 0636 02/09/21 0551  NA 138 138 136 138 140 143  K 5.1 3.8 4.1 3.8 4.2 3.9  CL 106 101  --   --   --  107  CO2 18* 23  --   --   --  18*  GLUCOSE 211* 279*  --   --   --  129*  BUN 23 23  --   --   --  27*  CREATININE 1.31* 1.29*  --   --   --  1.28*  CALCIUM 9.1 9.2  --   --   --  9.0  MG  --  2.2  --   --   --   --   PHOS  --  5.6*  --   --   --   --    GFR: Estimated Creatinine Clearance: 62.3 mL/min (A) (by C-G formula based on SCr of 1.28 mg/dL (H)). Recent Labs  Lab 02/08/21 0115 02/08/21 0308 02/08/21 0828 02/09/21 0254 02/09/21 0551  PROCALCITON  --  <0.10  --  0.60  --   WBC 7.5  --  14.9*  --  8.3  LATICACIDVEN 4.1* 3.0* 2.2*  --   --    Liver Function Tests: Recent Labs  Lab 02/08/21 0115  AST 47*  ALT 51*  ALKPHOS 76  BILITOT 1.0  PROT 7.4  ALBUMIN 4.0   No results for input(s): LIPASE, AMYLASE in the last 168 hours. No results for input(s): AMMONIA in the  last 168 hours. ABG    Component Value Date/Time   PHART 7.362 02/08/2021 0636   PCO2ART 41.9 02/08/2021 0636   PO2ART 169 (H) 02/08/2021 0636   HCO3 23.8 02/08/2021 0636   TCO2 25 02/08/2021 0636   ACIDBASEDEF 2.0 02/08/2021 0636   O2SAT 99.0 02/08/2021 0636    Coagulation Profile: Recent  Labs  Lab 02/08/21 0308  INR 1.0   Cardiac Enzymes: No results for input(s): CKTOTAL, CKMB, CKMBINDEX, TROPONINI in the last 168 hours. HbA1C: Hemoglobin A1C  Date/Time Value Ref Range Status  01/08/2021 03:29 PM 6.0 (A) 4.0 - 5.6 % Final  07/05/2020 08:14 AM 6.8 (A) 4.0 - 5.6 % Final   Hgb A1c MFr Bld  Date/Time Value Ref Range Status  03/13/2020 02:36 PM 7.4 (H) 4.8 - 5.6 % Final    Comment:    (NOTE) Pre diabetes:          5.7%-6.4% Diabetes:              >6.4% Glycemic control for   <7.0% adults with diabetes    CBG: Recent Labs  Lab 02/08/21 1629 02/08/21 1943 02/08/21 2330 02/09/21 0333 02/09/21 0754  GLUCAP 121* 116* 112* 121* 113*

## 2021-02-09 NOTE — Progress Notes (Signed)
Peripherally Inserted Central Catheter Placement  The IV Nurse has discussed with the patient and/or persons authorized to consent for the patient, the purpose of this procedure and the potential benefits and risks involved with this procedure.  The benefits include less needle sticks, lab draws from the catheter, and the patient may be discharged home with the catheter. Risks include, but not limited to, infection, bleeding, blood clot (thrombus formation), and puncture of an artery; nerve damage and irregular heartbeat and possibility to perform a PICC exchange if needed/ordered by physician.  Alternatives to this procedure were also discussed.  Bard Power PICC patient education guide, fact sheet on infection prevention and patient information card has been provided to patient /or left at bedside.    PICC Placement Documentation  PICC Double Lumen 02/09/21 PICC Right Brachial 41 cm 0 cm (Active)  Indication for Insertion or Continuance of Line Vasoactive infusions 02/09/21 1803  Exposed Catheter (cm) 0 cm 02/09/21 1803  Site Assessment Clean;Dry;Intact 02/09/21 1803  Lumen #1 Status Flushed;Blood return noted 02/09/21 1803  Lumen #2 Status Flushed;Blood return noted 02/09/21 1803  Dressing Type Transparent 02/09/21 1803  Dressing Status Clean;Dry;Intact 02/09/21 1803  Antimicrobial disc in place? Yes 02/09/21 1803  Dressing Intervention New dressing 02/09/21 1803  Dressing Change Due 02/16/21 02/09/21 1803       [name] 02/09/2021, 6:05 PM

## 2021-02-09 NOTE — Consult Note (Addendum)
Advanced Heart Failure Team Consult Note   Primary Physician: Ardith Dark, MD PCP-Cardiologist:  No primary care provider on file.  Reason for Consultation: Acute Systolic Heart Failure   HPI:    Devin Goodman is seen today for evaluation of acute systolic heart failure at the request of Dr Celine Mans.   Devin Goodman is a 65 year old retired Engineer, civil (consulting) with a history of DMII, hyperlipidemia, and hypertension. Recently stopped ACEi due to cough. Says he had a LHC 4-5 years ago. No intervention was needed. No family history of coronary disease.   Relocated to [place] from [place] in 2020. He has been on metoprolol and lisinopril for a few years. Saw PCP in January had a cough so lisinopril was stopped. Over the last 2 weeks he developed progressive shortness of breath with exertion and orthopnea.   Presented to [facility] ED with increased shortness of breath. On arrival oxygen sats low and SBP > 220. Initially on Bipap but sats remained low. Urgently intubated in the ED. Started on nitro drip. CTA- negative for PE, diffuse ground glass opacity, and large areas of consolidation in the lower lungs with possible ARDs.  CXR concerning for pulmonary edema.  HS Trop [id number].  Lactic acid 4.1.  SARS 2 negative. Blood cultures obtained. Started on antibiotics for possible PNA and given 40 mg IV lasix. Echo completed and showed severely reduced LVEF <20%, normal RV, and small pericardial effusion.   Initially on nitro drip but pressure dropped so neo was started. Neo later stopped. Extubated earlier today to 4 liters [place].   Still uncomfortable lying back.    Review of Systems: [y] = yes, [ ]  = no   . General: Weight gain [ ] ; Weight loss [ ] ; Anorexia [ ] ; Fatigue [ Y]; Fever [ ] ; Chills [ ] ; Weakness [Y ]  . Cardiac: Chest pain/pressure [ ] ; Resting SOB [Y ]; Exertional SOB [ Y]; Orthopnea [ ] ; Pedal Edema [ ] ; Palpitations [Y ]; Syncope [ ] ; Presyncope [ ] ; Paroxysmal nocturnal dyspnea[ ]   . Pulmonary: Cough [Y  ]; Wheezing[ ] ; Hemoptysis[ ] ; Sputum [ ] ; Snoring [ ]   . GI: Vomiting[ ] ; Dysphagia[ ] ; Melena[ ] ; Hematochezia [ ] ; Heartburn[ ] ; Abdominal pain [ ] ; Constipation [ ] ; Diarrhea [ ] ; BRBPR [ ]   . GU: Hematuria[ ] ; Dysuria [ ] ; Nocturia[ ]   . Vascular: Pain in legs with walking [ ] ; Pain in feet with lying flat [ ] ; Non-healing sores [ ] ; Stroke [ ] ; TIA [ ] ; Slurred speech [ ] ;  . Neuro: Headaches[ ] ; Vertigo[ ] ; Seizures[ ] ; Paresthesias[ ] ;Blurred vision [ ] ; Diplopia [ ] ; Vision changes [ ]   . Ortho/Skin: Arthritis [ ] ; Joint pain [ ] ; Muscle pain [ ] ; Joint swelling [ ] ; Back Pain [ ] ; Rash [ ]   . Psych: Depression[ ] ; Anxiety[ ]   . Heme: Bleeding problems [ ] ; Clotting disorders [ ] ; Anemia [ ]   . Endocrine: Diabetes [ Y]; Thyroid dysfunction[ ]   Home Medications Prior to Admission medications   Medication Sig Start Date End Date Taking? Authorizing Provider  aspirin EC 81 MG tablet Take 81 mg by mouth daily. Swallow whole.   Yes [provider]  atorvastatin (LIPITOR) 20 MG tablet Take 1 tablet (20 mg total) by mouth daily. 07/04/20  Yes , MD  glyBURIDE (DIABETA) 5 MG tablet Take 1 tablet (5 mg total) by mouth 2 (two) times daily with a meal. 07/04/20  Yes  M, MD  linagliptin (TRADJENTA) 5 MG TABS tablet Take 1 tablet (5 mg total) by mouth daily. 03/13/20  Yes Burky, Dorene Grebe B, NP  lisinopril (ZESTRIL) 10 MG tablet Take 10 mg by mouth daily. 01/10/21  Yes [provider]  metoprolol succinate (TOPROL XL) 25 MG 24 hr tablet Take 1 tablet (25 mg total) by mouth daily. 07/04/20  Yes Ardith Dark, MD    Past Medical History: Past Medical History:  Diagnosis Date  . Diabetes mellitus without complication (HCC)   . Hyperlipidemia   . Hypertension     Past Surgical History: Past Surgical History:  Procedure Laterality Date  . INGUINAL HERNIA REPAIR Left     Family History: Family History  Problem Relation Age of Onset  . Cancer Mother      Social History: Social History   Socioeconomic History  . Marital status: Married    Spouse name: Not on file  . Number of children: Not on file  . Years of education: Not on file  . Highest education level: Not on file  Occupational History  . Not on file  Tobacco Use  . Smoking status: Never Smoker  . Smokeless tobacco: Never Used  Vaping Use  . Vaping Use: Never used  Substance and Sexual Activity  . Alcohol use: Yes    Comment: occ  . Drug use: Never  . Sexual activity: Not on file  Other Topics Concern  . Not on file  Social History Narrative  . Not on file   Social Determinants of Health   Financial Resource Strain: Not on file  Food Insecurity: Not on file  Transportation Needs: Not on file  Physical Activity: Not on file  Stress: Not on file  Social Connections: Not on file    Allergies:  Allergies  Allergen Reactions  . Penicillins Nausea And Vomiting and Other (See Comments)    vomiting    Objective:    Vital Signs:   Temp:  [96.5 F (35.8 C)-100.2 F (37.9 C)] 98.7 F (37.1 C) (02/11 1100) Pulse Rate:  [62-76] 70 (02/11 1100) Resp:  [10-24] 12 (02/11 1100) BP: (80-130)/(61-89) 120/65 (02/11 1030) SpO2:  [98 %-100 %] 100 % (02/11 1100) FiO2 (%):  [40 %] 40 % (02/11 0905) Weight:  [80.3 kg] 80.3 kg (02/11 0015) Last BM Date:  (PTA)  Weight change: Filed Weights   02/08/21 0330 02/08/21 0617 02/09/21 0015  Weight: 83 kg 82.1 kg 80.3 kg    Intake/Output:   Intake/Output Summary (Last 24 hours) at 02/09/2021 1446 Last data filed at 02/09/2021 1030 Gross per 24 hour  Intake 1250.48 ml  Output 2210 ml  Net -959.52 ml      Physical Exam    General:. No resp difficulty HEENT: normal Neck: supple. JVP 10-11  . Carotids 2+ bilat; no bruits. No lymphadenopathy or thyromegaly appreciated. Cor: PMI nondisplaced. Regular rate & rhythm. No rubs, or murmurs. +S3  Lungs: clear on 4 liters [place]>  Abdomen: soft, nontender, nondistended. No  hepatosplenomegaly. No bruits or masses. Good bowel sounds. Extremities: no cyanosis, clubbing, rash, R and LLE from knee down cool.  Neuro: alert & orientedx3, cranial nerves grossly intact. moves all 4 extremities w/o difficulty. Affect pleasant   Telemetry  ST with episodes of SVT 100-130s   EKG    SR 100 bpm   Labs   Basic Metabolic Panel: Recent Labs  Lab 02/08/21 0115 02/08/21 0308 02/08/21 1610 02/08/21 0326 02/08/21 0636 02/09/21 0551  NA 138  138 136 138 140 143  K 5.1 3.8 4.1 3.8 4.2 3.9  CL 106 101  --   --   --  107  CO2 18* 23  --   --   --  18*  GLUCOSE 211* 279*  --   --   --  129*  BUN 23 23  --   --   --  27*  CREATININE 1.31* 1.29*  --   --   --  1.28*  CALCIUM 9.1 9.2  --   --   --  9.0  MG  --  2.2  --   --   --   --   PHOS  --  5.6*  --   --   --   --     Liver Function Tests: Recent Labs  Lab 02/08/21 0115  AST 47*  ALT 51*  ALKPHOS 76  BILITOT 1.0  PROT 7.4  ALBUMIN 4.0   No results for input(s): LIPASE, AMYLASE in the last 168 hours. No results for input(s): AMMONIA in the last 168 hours.  CBC: Recent Labs  Lab 02/08/21 0115 02/08/21 0318 02/08/21 0326 02/08/21 0636 02/08/21 0828 02/09/21 0551  WBC 7.5  --   --   --  14.9* 8.3  HGB 17.4* 18.0* 18.4* 16.3 16.0 14.8  HCT 56.6* 53.0* 54.0* 48.0 50.1 43.9  MCV 83.7  --   --   --  82.3 80.1  PLT 319  --   --   --  280 187    Cardiac Enzymes: No results for input(s): CKTOTAL, CKMB, CKMBINDEX, TROPONINI in the last 168 hours.  BNP: BNP (last 3 results) Recent Labs    02/08/21 0115  BNP 501.9*    ProBNP (last 3 results) No results for input(s): PROBNP in the last 8760 hours.   CBG: Recent Labs  Lab 02/08/21 1943 02/08/21 2330 02/09/21 0333 02/09/21 0754 02/09/21 1120  GLUCAP 116* 112* 121* 113* 134*    Coagulation Studies: Recent Labs    02/08/21 0308  LABPROT 13.0  INR 1.0     Imaging   DG Chest Port 1 View  Result Date: 02/09/2021 CLINICAL DATA:   Respiratory failure. EXAM: PORTABLE CHEST 1 VIEW COMPARISON:  02/08/2021 FINDINGS: 0318 hours. The NG tube passes into the stomach although the distal tip position is not included on the film. Endotracheal tube tip is approximately 2.3 cm above the base of the carina. Diffuse airspace disease seen on the previous study has decreased substantially in the interval. No appreciable pleural effusion. No pneumothorax. IMPRESSION: Interval improvement in diffuse bilateral airspace disease. Electronically Signed   By: Kennith Center M.D.   On: 02/09/2021 06:11   ECHOCARDIOGRAM COMPLETE  Result Date: 02/08/2021    ECHOCARDIOGRAM REPORT   Patient Name:   NOA CONSTANTE Date of Exam: 02/08/2021 Medical Rec #:  [id number]       Height:       72.0 in Accession #:    [id number]      Weight:       181.0 lb Date of Birth:  1956-11-03        BSA:          2.042 m Patient Age:    64 years        BP:           113/81 mmHg Patient Gender: M               HR:  75 bpm. Exam Location:  Inpatient Procedure: 2D Echo, Color Doppler and Cardiac Doppler                    REPORT CONTAINS CRITICAL RESULT  Severely reduced LVEF communicated via Epic with Dr. Celine Mans at 6:06 pm. Indications:    CHF  History:        Patient has no prior history of Echocardiogram examinations.                 Acute hypoxic respiratory failure and hypertensive urgency.  Sonographer:    Ross Ludwig RDCS (AE) Referring Phys: [id number] [facility]  Sonographer Comments: Echo performed with patient supine and on artificial respirator. IMPRESSIONS  1. Left ventricular ejection fraction, by estimation, is <20%. The left ventricle has severely decreased function. The left ventricle demonstrates global hypokinesis. The left ventricular internal cavity size was moderately dilated. There is moderate concentric left ventricular hypertrophy. Left ventricular diastolic parameters are indeterminate.  2. Right ventricular systolic function is moderately reduced. The right  ventricular size is normal.  3. Left atrial size was mildly dilated.  4. A small pericardial effusion is present. Large pleural effusion in both left and right lateral regions.  5. The mitral valve is normal in structure. Mild mitral valve regurgitation.  6. The aortic valve was not well visualized. There is mild calcification of the aortic valve. Aortic valve regurgitation is not visualized. Mild aortic valve sclerosis is present, with no evidence of aortic valve stenosis. Comparison(s): No prior Echocardiogram. FINDINGS  Left Ventricle: Left ventricular ejection fraction, by estimation, is <20%. The left ventricle has severely decreased function. The left ventricle demonstrates global hypokinesis. The left ventricular internal cavity size was moderately dilated. There is moderate concentric left ventricular hypertrophy. Left ventricular diastolic parameters are indeterminate. Right Ventricle: The right ventricular size is normal. Right vetricular wall thickness was not well visualized. Right ventricular systolic function is moderately reduced. Left Atrium: Left atrial size was mildly dilated. Right Atrium: Right atrial size was normal in size. Pericardium: A small pericardial effusion is present. Mitral Valve: The mitral valve is normal in structure. There is mild thickening of the mitral valve leaflet(s). There is mild calcification of the mitral valve leaflet(s). Mild mitral valve regurgitation. MV peak gradient, 1.9 mmHg. The mean mitral valve  gradient is 1.0 mmHg. Tricuspid Valve: The tricuspid valve is normal in structure. Tricuspid valve regurgitation is trivial. No evidence of tricuspid stenosis. Aortic Valve: The aortic valve was not well visualized. There is mild calcification of the aortic valve. Aortic valve regurgitation is not visualized. Mild aortic valve sclerosis is present, with no evidence of aortic valve stenosis. Aortic valve mean gradient measures 2.0 mmHg. Aortic valve peak gradient measures  3.5 mmHg. Aortic valve area, by VTI measures 2.12 cm. Pulmonic Valve: The pulmonic valve was not well visualized. Pulmonic valve regurgitation is trivial. Aorta: The aortic root, ascending aorta and aortic arch are all structurally normal, with no evidence of dilitation or obstruction. Venous: IVC assessment for right atrial pressure unable to be performed due to mechanical ventilation. IAS/Shunts: The atrial septum is grossly normal. Additional Comments: There is a large pleural effusion in both left and right lateral regions.  LEFT VENTRICLE PLAX 2D LVIDd:         6.00 cm  Diastology LVIDs:         5.50 cm  LV e' medial:    4.05 cm/s LV PW:         1.40 cm  LV E/e' medial:  12.9 LV IVS:        1.50 cm  LV e' lateral:   4.35 cm/s LVOT diam:     2.00 cm  LV E/e' lateral: 12.0 LV SV:         32 LV SV Index:   16 LVOT Area:     3.14 cm                          3D Volume EF:                         3D EF:        32 %                         LV EDV:       194 ml                         LV ESV:       133 ml                         LV SV:        62 ml RIGHT VENTRICLE            IVC RV Basal diam:  3.20 cm    IVC diam: 2.70 cm RV S prime:     8.25 cm/s TAPSE (M-mode): 1.1 cm LEFT ATRIUM             Index       RIGHT ATRIUM           Index LA diam:        3.60 cm 1.76 cm/m  RA Area:     15.90 cm LA Vol (A2C):   58.3 ml 28.55 ml/m RA Volume:   38.00 ml  18.61 ml/m LA Vol (A4C):   48.7 ml 23.85 ml/m LA Biplane Vol: 53.7 ml 26.30 ml/m  AORTIC VALVE AV Area (Vmax):    2.25 cm AV Area (Vmean):   1.80 cm AV Area (VTI):     2.12 cm AV Vmax:           93.20 cm/s AV Vmean:          76.100 cm/s AV VTI:            0.151 m AV Peak Grad:      3.5 mmHg AV Mean Grad:      2.0 mmHg LVOT Vmax:         66.70 cm/s LVOT Vmean:        43.700 cm/s LVOT VTI:          0.102 m LVOT/AV VTI ratio: 0.68  AORTA Ao Root diam: 3.60 cm Ao Asc diam:  3.40 cm MITRAL VALVE               TRICUSPID VALVE MV Area (PHT): 2.80 cm    TR Peak grad:    10.8 mmHg MV Area VTI:   1.40 cm    TR Vmax:        164.00 cm/s MV Peak grad:  1.9 mmHg MV Mean grad:  1.0 mmHg    SHUNTS MV Vmax:       0.69 m/s    Systemic VTI:  0.10 m MV Vmean:      38.4 cm/s  Systemic Diam: 2.00 cm MV Decel Time: 271 msec MV E velocity: 52.20 cm/s MV A velocity: 50.90 cm/s MV E/A ratio:  1.03 Devin Red MD Electronically signed by Devin Red MD Signature Date/Time: 02/08/2021/6:07:17 PM    Final       Medications:     Current Medications: . Chlorhexidine Gluconate Cloth  6 each Topical Daily  . docusate sodium  100 mg Oral BID  . enoxaparin (LOVENOX) injection  40 mg Subcutaneous Q24H  . insulin aspart  0-15 Units Subcutaneous Q4H  . mouth rinse  15 mL Mouth Rinse BID     Infusions: . sodium chloride    . dexmedetomidine (PRECEDEX) IV infusion 1.2 mcg/kg/hr (02/09/21 0700)      Assessment/Plan  1. Acute Hypoxic Respiratory Failure Possible CAP/pulmonary edema. -Intubated 2/10 --> extubated 4 liters [place] - Lactic acid 4.1>3>2.2 -Blood Cx- NGTD - Given dose of vanc/cefepime . Now stopped suspect pulmonary edema.    2. Hypertensive Urgency  -Recently stopped ACEi due to cough -Improved with Nitro drip--> developed hypotension. Nitro stopped.   3. Acute Systolic Heart Failure -Echo EF < 20% RV normal . No previous history of coronary disease.   -HS Trop 24>23>536 -BNP 501  - Tachycardic/cool/volume overloaded. Concerned he has low output heart failure - No bb for now with suspected low output.  -Given another 40 mg IV lasix now + digoxin 0.125 mg daily - No arb/spiro for now.  -Needs central access for CO-OX. Add milrinone 0.25 mcg.  -Will need cath to further assess hemodynamics + coronaries next week once stablized.   4. DMII On ssi   5. AKI  Creatinine on admit 1.3. Baseline < 1.   6.Tachycardia/SVT - Suspect due to low output heart failure  -Check TSH     Length of Stay: 1  Amy Clegg, NP  02/09/2021, 2:46  PM  Advanced Heart Failure Team Pager [phone] (M-F; 7a - 4p)  Please contact [facility] for night-coverage after hours (4p -7a ) and weekends on [email or web address]   Patient seen and examined with the above-signed Advanced Practice Provider and/or Housestaff. I personally reviewed laboratory data, imaging studies and relevant notes. I independently examined the patient and formulated the important aspects of the plan. I have edited the note to reflect any of my changes or salient points. I have personally discussed the plan with the patient and/or family.  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 EF 25%  Starting to feel better. But still orthopneic and tachycardic  General:  Sitting up mildly SOB HEENT: normal Neck: supple. JVP to jaw Carotids 2+ bilat; no bruits. No lymphadenopathy or thryomegaly appreciated. Cor: PMI nondisplaced. Tachy regular +s3 Lungs: clear Abdomen: soft, nontender, + distended. No hepatosplenomegaly. No bruits or masses. Good bowel sounds. Extremities: no cyanosis, clubbing, rash, edema  cool Neuro: alert & orientedx3, cranial nerves grossly intact. moves all 4 extremities w/o difficulty. 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 Now with low output and volume overload. Start milrinone, digoxin and spiro. Get central access to follow CVP and co-ox. Eventual R/L cath prior to d/c.   Arvilla Meres, MD  4:59 PM

## 2021-02-10 DIAGNOSIS — N179 Acute kidney failure, unspecified: Secondary | ICD-10-CM | POA: Diagnosis not present

## 2021-02-10 DIAGNOSIS — E876 Hypokalemia: Secondary | ICD-10-CM | POA: Diagnosis not present

## 2021-02-10 DIAGNOSIS — R570 Cardiogenic shock: Secondary | ICD-10-CM

## 2021-02-10 DIAGNOSIS — J810 Acute pulmonary edema: Secondary | ICD-10-CM | POA: Diagnosis not present

## 2021-02-10 DIAGNOSIS — J9601 Acute respiratory failure with hypoxia: Secondary | ICD-10-CM | POA: Diagnosis not present

## 2021-02-10 LAB — BASIC METABOLIC PANEL
Anion gap: 9 (ref 5–15)
BUN: 22 mg/dL (ref 8–23)
CO2: 27 mmol/L (ref 22–32)
Calcium: 8.6 mg/dL — ABNORMAL LOW (ref 8.9–10.3)
Chloride: 102 mmol/L (ref 98–111)
Creatinine, Ser: 1.08 mg/dL (ref 0.61–1.24)
GFR, Estimated: >60 mL/min (ref >60)
Glucose, Bld: 164 mg/dL — ABNORMAL HIGH (ref 70–99)
Potassium: 3.2 mmol/L — ABNORMAL LOW (ref 3.5–5.1)
Sodium: 138 mmol/L (ref 135–145)

## 2021-02-10 LAB — CULTURE, RESPIRATORY W GRAM STAIN: Culture: NORMAL

## 2021-02-10 LAB — COOXEMETRY PANEL
Carboxyhemoglobin: 1.1 % (ref 0.5–1.5)
Methemoglobin: 0.9 % (ref 0.0–1.5)
O2 Saturation: 69.2 %
Total hemoglobin: 13.2 g/dL (ref 12.0–16.0)

## 2021-02-10 LAB — GLUCOSE, CAPILLARY
Glucose-Capillary: 144 mg/dL — ABNORMAL HIGH (ref 70–99)
Glucose-Capillary: 125 mg/dL — ABNORMAL HIGH (ref 70–99)
Glucose-Capillary: 136 mg/dL — ABNORMAL HIGH (ref 70–99)
Glucose-Capillary: 215 mg/dL — ABNORMAL HIGH (ref 70–99)
Glucose-Capillary: 237 mg/dL — ABNORMAL HIGH (ref 70–99)
Glucose-Capillary: 304 mg/dL — ABNORMAL HIGH (ref 70–99)

## 2021-02-10 LAB — PROCALCITONIN: Procalcitonin: 0.22 ng/mL

## 2021-02-10 MED ORDER — CHLORPROMAZINE HCL 25 MG/ML IJ SOLN
10.0000 mg | Freq: Three times a day (TID) | INTRAMUSCULAR | Status: DC | PRN
  Administered 2021-02-10 – 2021-02-13 (×6): 10 mg via INTRAMUSCULAR
  Filled 2021-02-10 (×8): qty 0.4

## 2021-02-10 MED ORDER — SACUBITRIL-VALSARTAN 49-51 MG PO TABS
1.0000 | ORAL_TABLET | Freq: Two times a day (BID) | ORAL | Status: DC
  Administered 2021-02-10 – 2021-02-11 (×4): 1 via ORAL
  Filled 2021-02-10 (×6): qty 1

## 2021-02-10 MED ORDER — SPIRONOLACTONE 25 MG PO TABS
25.0000 mg | ORAL_TABLET | Freq: Every day | ORAL | Status: DC
  Administered 2021-02-10 – 2021-02-11 (×2): 25 mg via ORAL
  Filled 2021-02-10 (×2): qty 1

## 2021-02-10 MED ORDER — FUROSEMIDE 10 MG/ML IJ SOLN
80.0000 mg | Freq: Once | INTRAMUSCULAR | Status: AC
  Administered 2021-02-10: 80 mg via INTRAVENOUS
  Filled 2021-02-10: qty 8

## 2021-02-10 MED ORDER — POTASSIUM CHLORIDE CRYS ER 20 MEQ PO TBCR
40.0000 meq | EXTENDED_RELEASE_TABLET | Freq: Once | ORAL | Status: AC
  Administered 2021-02-10: 40 meq via ORAL
  Filled 2021-02-10: qty 2

## 2021-02-10 NOTE — Progress Notes (Signed)
   Notified by RN that CVP 1-3 on arrival to [facility], down from 7-8 earlier in the day. Patient is s/p IV lasix 80mg  today. BP stable. Will hold off on IVFs for now. No plans for additional lasix today. Favor watchful waiting.   , PA-C  02/10/21; 5:17 PM

## 2021-02-10 NOTE — Progress Notes (Signed)
Upon assessment, the patient had a fast irregular rhythm. Patient denies chest pain. Dayshift nurse said throughout the day his rhythm has been doing this, but would go back to NSR in a short amount of time. However, the last hour of her shift, the patient had stayed in this abnormal rhythm. I did a 12 lead EKG and nodified Elink of the results. No new orders at time.

## 2021-02-10 NOTE — Progress Notes (Addendum)
eLink Physician-Brief Progress Note Patient Name: Devin Goodman DOB: Dec 07, 1956 MRN: [id number]   Date of Service  02/10/2021  HPI/Events of Note  Bedside RN reports patient's CVP is registering as 2, BP 127/67, heart rate 106, saturation 96 %, RR 21.  eICU Interventions  No indication to treat with fluid boluses given stable hemodynamics and patient's history of severe cardiomyopathy with systolic cardiac dysfunction (EF 20 %). Lasix dose scheduled for a.m. will be held if CVP < 3.        Ronda Kazmi U Estelle Greenleaf 02/10/2021, 1:22 AM

## 2021-02-10 NOTE — Progress Notes (Addendum)
Pt's CVP between 1-3, confirmed with second RN. Called PA to inform the situation. BP 135/84, pt is alert and oriented times 4, oxygen @ 4 with 100%, tele running NSA According to PA, Krista, just hold fluid and lasix for today and keep monitoring.  Lonia Farber, RN

## 2021-02-10 NOTE — Progress Notes (Signed)
Patient's 12am CVP was 2. Elink notified. No new orders at this time.

## 2021-02-10 NOTE — Progress Notes (Signed)
CVP at 4 pm - 4.

## 2021-02-10 NOTE — Progress Notes (Addendum)
NAME:  Devin Goodman, MRN:  [id number], DOB:  April 01, 1956, LOS: 2 ADMISSION DATE:  02/08/2021, CONSULTATION DATE:  02/08/21 REFERRING MD:  Pilar Plate  CHIEF COMPLAINT:  SOB   Brief History   Devin Goodman is a 65 y.o. male who was admitted 2/10 with acute hypoxic respiratory failure and hypertensive urgency.  He required intubation in ED.   In ED, he was significantly hypertensive with highest SBP of 226.  He was started on propofol and nitro.  CTA chest neg for PE but did demonstrate bilateral GGO's with pulmonary edema and small effusions.   COVID negative and procalcitonin low.    Significant Hospital Events   2/10 > admit.  Consults:  None.  Procedures:  ETT 2/10 >   Significant Diagnostic Tests:  CTA chest 2/10 > no PE.  B/l GGO's, small effusions.  Micro Data:  Blood 2/10 >  Sputum 2/10 >  COVID 2/10 >  Flu 2/10 >   Antimicrobials:  Vanc 2/10 > 2/10 Zosyn 2/10 >  2/10  Interim history/subjective:  This morning awake and calm. On an SBT. Following command and passing.   Objective:  Blood pressure (!) 175/96, pulse 91, temperature 98.5 F (36.9 C), temperature source Oral, resp. rate (!) 28, height 6' (1.829 m), weight 81.6 kg, SpO2 97 %. CVP:  [1 mmHg-9 mmHg] 7 mmHg      Intake/Output Summary (Last 24 hours) at 02/10/2021 1127 Last data filed at 02/10/2021 1100 Gross per 24 hour  Intake 728.76 ml  Output 1425 ml  Net -696.24 ml   Filed Weights   02/08/21 0617 02/09/21 0015 02/10/21 0200  Weight: 82.1 kg 80.3 kg 81.6 kg    Examination: On exam he is intubated, calm, following commands Lungs are clear to auscultation bilaterally There is no peripheral edema His heart sounds are regular rate and rhythm, no murmurs rubs or gallops   Labs reviewed Sodium is 143, creatinine 1.28, hemoglobin 14.8, blood sugars controlled most recently 113   Assessment & Plan:   Acute hypoxic respiratory failure -likely in the setting of acute pulmonary edema - extubated to  bipap on 2/11.  - now on nasal cannula, wean down as tolerated goal oxygen saturation 90%  New onset heart failure reduced ejection fraction Cardiogenic Shock Echocardiogram shows EF of 20% On milrinone per heart failure Adding aldactone and entresto to help with BP Still getting diuresed per cardiology  Hypertensive Urgency - improved with diuresis and resuming other medications - likely in the setting of discontinued lisinopril due to cough  Type 2 DM - SSI. - Hold home Glyburide, Linagliptin.  Hypokalemia - replace potassium - recheck bmp - adding aldactone   Best Practice (evaluated daily):  Diet: heart healthy Pain/Anxiety/Delirium protocol (if indicated):  VAP protocol (if indicated): In place. DVT prophylaxis: SCD's / Heparin. GI prophylaxis: PPI. Glucose control: SSI. Mobility: Bedrest. Disposition: transfer to PCU to heart failure service. PCCM will be available if needed.   Goals of Care:  Last date of multidisciplinary goals of care discussion: None. Family and staff present: None. Summary of discussion: None. Follow up goals of care discussion due: 2/17. Code Status:  Full.  This patient is being transferred out of ICU today. I spent 35 minutes with greater than 50% of the time including face to face time, coordination of care including order reconciliation, patient transfer coordination, provider handoff. Discussed with Dr. Gala Romney. Cardiology to assume care.    Labs   CBC: Recent Labs  Lab 02/08/21 0115 02/08/21 6734  02/08/21 0326 02/08/21 0636 02/08/21 0828 02/09/21 0551  WBC 7.5  --   --   --  14.9* 8.3  HGB 17.4* 18.0* 18.4* 16.3 16.0 14.8  HCT 56.6* 53.0* 54.0* 48.0 50.1 43.9  MCV 83.7  --   --   --  82.3 80.1  PLT 319  --   --   --  280 187   Basic Metabolic Panel: Recent Labs  Lab 02/08/21 0115 02/08/21 0308 02/08/21 0318 02/08/21 0326 02/08/21 0636 02/09/21 0551 02/10/21 0327  NA 138 138 136 138 140 143 138  K 5.1 3.8 4.1  3.8 4.2 3.9 3.2*  CL 106 101  --   --   --  107 102  CO2 18* 23  --   --   --  18* 27  GLUCOSE 211* 279*  --   --   --  129* 164*  BUN 23 23  --   --   --  27* 22  CREATININE 1.31* 1.29*  --   --   --  1.28* 1.08  CALCIUM 9.1 9.2  --   --   --  9.0 8.6*  MG  --  2.2  --   --   --   --   --   PHOS  --  5.6*  --   --   --   --   --    GFR: Estimated Creatinine Clearance: 73.8 mL/min (by C-G formula based on SCr of 1.08 mg/dL). Recent Labs  Lab 02/08/21 0115 02/08/21 0308 02/08/21 0828 02/09/21 0254 02/09/21 0551 02/10/21 0327  PROCALCITON  --  <0.10  --  0.60  --  0.22  WBC 7.5  --  14.9*  --  8.3  --   LATICACIDVEN 4.1* 3.0* 2.2*  --   --   --    Liver Function Tests: Recent Labs  Lab 02/08/21 0115  AST 47*  ALT 51*  ALKPHOS 76  BILITOT 1.0  PROT 7.4  ALBUMIN 4.0   No results for input(s): LIPASE, AMYLASE in the last 168 hours. No results for input(s): AMMONIA in the last 168 hours. ABG    Component Value Date/Time   PHART 7.362 02/08/2021 0636   PCO2ART 41.9 02/08/2021 0636   PO2ART 169 (H) 02/08/2021 0636   HCO3 23.8 02/08/2021 0636   TCO2 25 02/08/2021 0636   ACIDBASEDEF 2.0 02/08/2021 0636   O2SAT 69.2 02/10/2021 0327    Coagulation Profile: Recent Labs  Lab 02/08/21 0308  INR 1.0   Cardiac Enzymes: No results for input(s): CKTOTAL, CKMB, CKMBINDEX, TROPONINI in the last 168 hours. HbA1C: Hemoglobin A1C  Date/Time Value Ref Range Status  01/08/2021 03:29 PM 6.0 (A) 4.0 - 5.6 % Final  07/05/2020 08:14 AM 6.8 (A) 4.0 - 5.6 % Final   Hgb A1c MFr Bld  Date/Time Value Ref Range Status  03/13/2020 02:36 PM 7.4 (H) 4.8 - 5.6 % Final    Comment:    (NOTE) Pre diabetes:          5.7%-6.4% Diabetes:              >6.4% Glycemic control for   <7.0% adults with diabetes    CBG: Recent Labs  Lab 02/09/21 1534 02/09/21 2042 02/09/21 2312 02/10/21 0332 02/10/21 0814  GLUCAP 100* 230* 117* 144* 125*

## 2021-02-10 NOTE — Progress Notes (Addendum)
Advanced Heart Failure Rounding Note   Subjective:    Milrinone 0.25 started 2/1. Remains on IV lasix. Co-ox 69%  CVP 7-8  Breathing much better but still orthopneic.    Objective:   Weight Range:  Vital Signs:   Temp:  [98.5 F (36.9 C)-99.4 F (37.4 C)] 98.5 F (36.9 C) (02/12 0340) Pulse Rate:  [66-115] 103 (02/12 0900) Resp:  [12-33] 29 (02/12 0900) BP: (90-189)/(59-109) 189/99 (02/12 0900) SpO2:  [91 %-100 %] 94 % (02/12 0900) Weight:  [81.6 kg] 81.6 kg (02/12 0200) Last BM Date:  (PTA)  Weight change: Filed Weights   02/08/21 0617 02/09/21 0015 02/10/21 0200  Weight: 82.1 kg 80.3 kg 81.6 kg    Intake/Output:   Intake/Output Summary (Last 24 hours) at 02/10/2021 0932 Last data filed at 02/10/2021 0900 Gross per 24 hour  Intake 956.72 ml  Output 1525 ml  Net -568.28 ml     Physical Exam: General:  Sitting up in bed No resp difficulty HEENT: normal Neck: supple. JVP 7. Carotids 2+ bilat; no bruits. No lymphadenopathy or thryomegaly appreciated. Cor: PMI nondisplaced. Regular tachy + s3 Lungs: clear Abdomen: soft, nontender, nondistended. No hepatosplenomegaly. No bruits or masses. Good bowel sounds. Extremities: no cyanosis, clubbing, rash, edema Neuro: alert & orientedx3, cranial nerves grossly intact. moves all 4 extremities w/o difficulty. Affect pleasant  Telemetry: Sinus 100-110 Personally reviewed   Labs: Basic Metabolic Panel: Recent Labs  Lab 02/08/21 0115 02/08/21 0308 02/08/21 9924 02/08/21 0326 02/08/21 0636 02/09/21 0551 02/10/21 0327  NA 138 138 136 138 140 143 138  K 5.1 3.8 4.1 3.8 4.2 3.9 3.2*  CL 106 101  --   --   --  107 102  CO2 18* 23  --   --   --  18* 27  GLUCOSE 211* 279*  --   --   --  129* 164*  BUN 23 23  --   --   --  27* 22  CREATININE 1.31* 1.29*  --   --   --  1.28* 1.08  CALCIUM 9.1 9.2  --   --   --  9.0 8.6*  MG  --  2.2  --   --   --   --   --   PHOS  --  5.6*  --   --   --   --   --     Liver  Function Tests: Recent Labs  Lab 02/08/21 0115  AST 47*  ALT 51*  ALKPHOS 76  BILITOT 1.0  PROT 7.4  ALBUMIN 4.0   No results for input(s): LIPASE, AMYLASE in the last 168 hours. No results for input(s): AMMONIA in the last 168 hours.  CBC: Recent Labs  Lab 02/08/21 0115 02/08/21 0318 02/08/21 0326 02/08/21 0636 02/08/21 0828 02/09/21 0551  WBC 7.5  --   --   --  14.9* 8.3  HGB 17.4* 18.0* 18.4* 16.3 16.0 14.8  HCT 56.6* 53.0* 54.0* 48.0 50.1 43.9  MCV 83.7  --   --   --  82.3 80.1  PLT 319  --   --   --  280 187    Cardiac Enzymes: No results for input(s): CKTOTAL, CKMB, CKMBINDEX, TROPONINI in the last 168 hours.  BNP: BNP (last 3 results) Recent Labs    02/08/21 0115  BNP 501.9*    ProBNP (last 3 results) No results for input(s): PROBNP in the last 8760 hours.    Other results:  Imaging: DG Chest Port 1 View  Result Date: 02/09/2021 CLINICAL DATA:  Respiratory failure. EXAM: PORTABLE CHEST 1 VIEW COMPARISON:  02/08/2021 FINDINGS: 0318 hours. The NG tube passes into the stomach although the distal tip position is not included on the film. Endotracheal tube tip is approximately 2.3 cm above the base of the carina. Diffuse airspace disease seen on the previous study has decreased substantially in the interval. No appreciable pleural effusion. No pneumothorax. IMPRESSION: Interval improvement in diffuse bilateral airspace disease. Electronically Signed   By: Kennith Center M.D.   On: 02/09/2021 06:11   ECHOCARDIOGRAM COMPLETE  Result Date: 02/08/2021    ECHOCARDIOGRAM REPORT   Patient Name:   Devin Goodman Date of Exam: 02/08/2021 Medical Rec #:  [id number]       Height:       72.0 in Accession #:    [id number]      Weight:       181.0 lb Date of Birth:  February 19, 1956        BSA:          2.042 m Patient Age:    64 years        BP:           113/81 mmHg Patient Gender: M               HR:           75 bpm. Exam Location:  Inpatient Procedure: 2D Echo, Color Doppler  and Cardiac Doppler                    REPORT CONTAINS CRITICAL RESULT  Severely reduced LVEF communicated via Epic with Dr. Celine Mans at 6:06 pm. Indications:    CHF  History:        Patient has no prior history of Echocardiogram examinations.                 Acute hypoxic respiratory failure and hypertensive urgency.  Sonographer:    Ross Ludwig RDCS (AE) Referring Phys: [id number] [facility]  Sonographer Comments: Echo performed with patient supine and on artificial respirator. IMPRESSIONS  1. Left ventricular ejection fraction, by estimation, is <20%. The left ventricle has severely decreased function. The left ventricle demonstrates global hypokinesis. The left ventricular internal cavity size was moderately dilated. There is moderate concentric left ventricular hypertrophy. Left ventricular diastolic parameters are indeterminate.  2. Right ventricular systolic function is moderately reduced. The right ventricular size is normal.  3. Left atrial size was mildly dilated.  4. A small pericardial effusion is present. Large pleural effusion in both left and right lateral regions.  5. The mitral valve is normal in structure. Mild mitral valve regurgitation.  6. The aortic valve was not well visualized. There is mild calcification of the aortic valve. Aortic valve regurgitation is not visualized. Mild aortic valve sclerosis is present, with no evidence of aortic valve stenosis. Comparison(s): No prior Echocardiogram. FINDINGS  Left Ventricle: Left ventricular ejection fraction, by estimation, is <20%. The left ventricle has severely decreased function. The left ventricle demonstrates global hypokinesis. The left ventricular internal cavity size was moderately dilated. There is moderate concentric left ventricular hypertrophy. Left ventricular diastolic parameters are indeterminate. Right Ventricle: The right ventricular size is normal. Right vetricular wall thickness was not well visualized. Right ventricular systolic  function is moderately reduced. Left Atrium: Left atrial size was mildly dilated. Right Atrium: Right atrial size was normal in size. Pericardium: A small pericardial effusion is present.  Mitral Valve: The mitral valve is normal in structure. There is mild thickening of the mitral valve leaflet(s). There is mild calcification of the mitral valve leaflet(s). Mild mitral valve regurgitation. MV peak gradient, 1.9 mmHg. The mean mitral valve  gradient is 1.0 mmHg. Tricuspid Valve: The tricuspid valve is normal in structure. Tricuspid valve regurgitation is trivial. No evidence of tricuspid stenosis. Aortic Valve: The aortic valve was not well visualized. There is mild calcification of the aortic valve. Aortic valve regurgitation is not visualized. Mild aortic valve sclerosis is present, with no evidence of aortic valve stenosis. Aortic valve mean gradient measures 2.0 mmHg. Aortic valve peak gradient measures 3.5 mmHg. Aortic valve area, by VTI measures 2.12 cm. Pulmonic Valve: The pulmonic valve was not well visualized. Pulmonic valve regurgitation is trivial. Aorta: The aortic root, ascending aorta and aortic arch are all structurally normal, with no evidence of dilitation or obstruction. Venous: IVC assessment for right atrial pressure unable to be performed due to mechanical ventilation. IAS/Shunts: The atrial septum is grossly normal. Additional Comments: There is a large pleural effusion in both left and right lateral regions.  LEFT VENTRICLE PLAX 2D LVIDd:         6.00 cm  Diastology LVIDs:         5.50 cm  LV e' medial:    4.05 cm/s LV PW:         1.40 cm  LV E/e' medial:  12.9 LV IVS:        1.50 cm  LV e' lateral:   4.35 cm/s LVOT diam:     2.00 cm  LV E/e' lateral: 12.0 LV SV:         32 LV SV Index:   16 LVOT Area:     3.14 cm                          3D Volume EF:                         3D EF:        32 %                         LV EDV:       194 ml                         LV ESV:       133 ml                          LV SV:        62 ml RIGHT VENTRICLE            IVC RV Basal diam:  3.20 cm    IVC diam: 2.70 cm RV S prime:     8.25 cm/s TAPSE (M-mode): 1.1 cm LEFT ATRIUM             Index       RIGHT ATRIUM           Index LA diam:        3.60 cm 1.76 cm/m  RA Area:     15.90 cm LA Vol (A2C):   58.3 ml 28.55 ml/m RA Volume:   38.00 ml  18.61 ml/m LA Vol (A4C):   48.7 ml 23.85 ml/m LA Biplane Vol: 53.7  ml 26.30 ml/m  AORTIC VALVE AV Area (Vmax):    2.25 cm AV Area (Vmean):   1.80 cm AV Area (VTI):     2.12 cm AV Vmax:           93.20 cm/s AV Vmean:          76.100 cm/s AV VTI:            0.151 m AV Peak Grad:      3.5 mmHg AV Mean Grad:      2.0 mmHg LVOT Vmax:         66.70 cm/s LVOT Vmean:        43.700 cm/s LVOT VTI:          0.102 m LVOT/AV VTI ratio: 0.68  AORTA Ao Root diam: 3.60 cm Ao Asc diam:  3.40 cm MITRAL VALVE               TRICUSPID VALVE MV Area (PHT): 2.80 cm    TR Peak grad:   10.8 mmHg MV Area VTI:   1.40 cm    TR Vmax:        164.00 cm/s MV Peak grad:  1.9 mmHg MV Mean grad:  1.0 mmHg    SHUNTS MV Vmax:       0.69 m/s    Systemic VTI:  0.10 m MV Vmean:      38.4 cm/s   Systemic Diam: 2.00 cm MV Decel Time: 271 msec MV E velocity: 52.20 cm/s MV A velocity: 50.90 cm/s MV E/A ratio:  1.03 Jodelle Red MD Electronically signed by Jodelle Red MD Signature Date/Time: 02/08/2021/6:07:17 PM    Final    [place] EKG SITE RITE  Result Date: 02/09/2021 If Site Rite image not attached, placement could not be confirmed due to current cardiac rhythm.     Medications:     Scheduled Medications: . Chlorhexidine Gluconate Cloth  6 each Topical Daily  . digoxin  0.125 mg Oral Daily  . docusate sodium  100 mg Oral BID  . enoxaparin (LOVENOX) injection  40 mg Subcutaneous Q24H  . furosemide  40 mg Intravenous BID  . insulin aspart  0-15 Units Subcutaneous Q4H  . mouth rinse  15 mL Mouth Rinse BID  . sodium chloride flush  10-40 mL Intracatheter Q12H     Infusions: .  sodium chloride    . dexmedetomidine (PRECEDEX) IV infusion Stopped (02/09/21 6433)  . milrinone 0.25 mcg/kg/min (02/10/21 0900)     PRN Medications:  docusate sodium, labetalol, polyethylene glycol, sodium chloride flush   Assessment/Plan:   1. Acute Hypoxic Respiratory Failure due to pulmonary edema/cardiogenic shock - Intubated 2/10 --> extubated 4 liters [place] - Lactic acid 4.1>3>2.2 - Improved  2. Acute Systolic Heart Failure -> cardiogenic shock -Echo EF < 20% RV moderate HK . No previous history of coronary disease.   -HS Trop 26>45>173 -BNP 501  - Now on milrinone 0.25. Co-ox 69% CVP 7-8. Continue IV lasix today - Continue digoxin  - start spiro 25 - Entresto 49/51 bid - PRN hydral  - R/L cath on Monday  3. Hypertensive Urgency  - Recently stopped ACEi due to cough - BP remains high. Med changes as above  4. DMII On ssi   5. AKI  - Cardiorenal/ATN - Creatinine on admit 1.3. Baseline < 1.  - resolved  6. Hypokalemia  - supp - add spiro   Length of Stay: 2   Arvilla Meres MD 02/10/2021, 9:32 AM  Advanced  Heart Failure Team Pager [phone] (M-F; [place])  Please contact [facility] for night-coverage after hours (4p -7a ) and weekends on [email or web address]

## 2021-02-11 DIAGNOSIS — J810 Acute pulmonary edema: Secondary | ICD-10-CM | POA: Diagnosis not present

## 2021-02-11 DIAGNOSIS — J9601 Acute respiratory failure with hypoxia: Secondary | ICD-10-CM | POA: Diagnosis not present

## 2021-02-11 DIAGNOSIS — N179 Acute kidney failure, unspecified: Secondary | ICD-10-CM | POA: Diagnosis not present

## 2021-02-11 DIAGNOSIS — E876 Hypokalemia: Secondary | ICD-10-CM | POA: Diagnosis not present

## 2021-02-11 LAB — BASIC METABOLIC PANEL
Anion gap: 12 (ref 5–15)
BUN: 14 mg/dL (ref 8–23)
CO2: 30 mmol/L (ref 22–32)
Calcium: 8.7 mg/dL — ABNORMAL LOW (ref 8.9–10.3)
Chloride: 97 mmol/L — ABNORMAL LOW (ref 98–111)
Creatinine, Ser: 1.06 mg/dL (ref 0.61–1.24)
GFR, Estimated: >60 mL/min (ref >60)
Glucose, Bld: 127 mg/dL — ABNORMAL HIGH (ref 70–99)
Potassium: 3 mmol/L — ABNORMAL LOW (ref 3.5–5.1)
Sodium: 139 mmol/L (ref 135–145)

## 2021-02-11 LAB — GLUCOSE, CAPILLARY
Glucose-Capillary: 116 mg/dL — ABNORMAL HIGH (ref 70–99)
Glucose-Capillary: 226 mg/dL — ABNORMAL HIGH (ref 70–99)
Glucose-Capillary: 129 mg/dL — ABNORMAL HIGH (ref 70–99)
Glucose-Capillary: 256 mg/dL — ABNORMAL HIGH (ref 70–99)
Glucose-Capillary: 173 mg/dL — ABNORMAL HIGH (ref 70–99)

## 2021-02-11 LAB — COOXEMETRY PANEL
Carboxyhemoglobin: 1 % (ref 0.5–1.5)
Methemoglobin: 0.6 % (ref 0.0–1.5)
O2 Saturation: 72.3 %
Total hemoglobin: 13.9 g/dL (ref 12.0–16.0)

## 2021-02-11 MED ORDER — MILRINONE LACTATE IN DEXTROSE 20-5 MG/100ML-% IV SOLN
0.2500 ug/kg/min | INTRAVENOUS | Status: DC
  Administered 2021-02-11: 0.25 ug/kg/min via INTRAVENOUS
  Filled 2021-02-11: qty 100

## 2021-02-11 MED ORDER — POTASSIUM CHLORIDE CRYS ER 20 MEQ PO TBCR
20.0000 meq | EXTENDED_RELEASE_TABLET | ORAL | Status: AC
  Administered 2021-02-11 (×2): 20 meq via ORAL
  Filled 2021-02-11 (×2): qty 1

## 2021-02-11 MED ORDER — ASPIRIN 81 MG PO CHEW
81.0000 mg | CHEWABLE_TABLET | ORAL | Status: AC
  Administered 2021-02-12: 81 mg via ORAL
  Filled 2021-02-11: qty 1

## 2021-02-11 MED ORDER — SODIUM CHLORIDE 0.9 % IV SOLN: INTRAVENOUS | Status: DC

## 2021-02-11 MED ORDER — POTASSIUM CHLORIDE 10 MEQ/100ML IV SOLN
10.0000 meq | INTRAVENOUS | Status: AC
  Administered 2021-02-11 (×4): 10 meq via INTRAVENOUS
  Filled 2021-02-11 (×3): qty 100

## 2021-02-11 MED ORDER — SODIUM CHLORIDE 0.9% FLUSH: 3.0000 mL | Freq: Two times a day (BID) | INTRAVENOUS | Status: DC

## 2021-02-11 MED ORDER — SODIUM CHLORIDE 0.9% FLUSH: 3.0000 mL | INTRAVENOUS | Status: DC | PRN

## 2021-02-11 MED ORDER — SODIUM CHLORIDE 0.9 % IV SOLN: 250.0000 mL | INTRAVENOUS | Status: DC | PRN

## 2021-02-11 MED ORDER — MILRINONE LACTATE IN DEXTROSE 20-5 MG/100ML-% IV SOLN
0.1250 ug/kg/min | INTRAVENOUS | Status: DC
  Administered 2021-02-11: 0.125 ug/kg/min via INTRAVENOUS

## 2021-02-11 NOTE — Progress Notes (Signed)
Advanced Heart Failure Rounding Note   Subjective:    Remains on milrinone 0.25. Co-ox 72% Diuretics stopped yesterday due to low CVP.   Feeling better. No CP. Still with mild orthopnea.   Objective:   Weight Range:  Vital Signs:   Temp:  [97.5 F (36.4 C)-98.2 F (36.8 C)] 97.5 F (36.4 C) (02/13 1114) Pulse Rate:  [79-101] 98 (02/13 1114) Resp:  [18-31] 18 (02/13 1114) BP: (103-182)/(73-125) 130/73 (02/13 1114) SpO2:  [92 %-100 %] 96 % (02/13 1114) Last BM Date: (P) 02/10/21  Weight change: Filed Weights   02/08/21 0617 02/09/21 0015 02/10/21 0200  Weight: 82.1 kg 80.3 kg 81.6 kg    Intake/Output:   Intake/Output Summary (Last 24 hours) at 02/11/2021 1133 Last data filed at 02/11/2021 0600 Gross per 24 hour  Intake 162.36 ml  Output 3625 ml  Net -3462.64 ml     Physical Exam: General:  Well appearing. No resp difficulty HEENT: normal Neck: supple. no JVD. Carotids 2+ bilat; no bruits. No lymphadenopathy or thryomegaly appreciated. Cor: PMI nondisplaced. Regular rate & rhythm. No rubs, gallops or murmurs. Lungs: clear Abdomen: soft, nontender, nondistended. No hepatosplenomegaly. No bruits or masses. Good bowel sounds. Extremities: no cyanosis, clubbing, rash, edema Neuro: alert & orientedx3, cranial nerves grossly intact. moves all 4 extremities w/o difficulty. Affect pleasant   Telemetry: Sinus 90-100 Personally reviewed   Labs: Basic Metabolic Panel: Recent Labs  Lab 02/08/21 0115 02/08/21 0308 02/08/21 8546 02/08/21 0326 02/08/21 0636 02/09/21 0551 02/10/21 0327 02/11/21 0352  NA 138 138   < > 138 140 143 138 139  K 5.1 3.8   < > 3.8 4.2 3.9 3.2* 3.0*  CL 106 101  --   --   --  107 102 97*  CO2 18* 23  --   --   --  18* 27 30  GLUCOSE 211* 279*  --   --   --  129* 164* 127*  BUN 23 23  --   --   --  27* 22 14  CREATININE 1.31* 1.29*  --   --   --  1.28* 1.08 1.06  CALCIUM 9.1 9.2  --   --   --  9.0 8.6* 8.7*  MG  --  2.2  --   --   --    --   --   --   PHOS  --  5.6*  --   --   --   --   --   --    < > = values in this interval not displayed.    Liver Function Tests: Recent Labs  Lab 02/08/21 0115  AST 47*  ALT 51*  ALKPHOS 76  BILITOT 1.0  PROT 7.4  ALBUMIN 4.0   No results for input(s): LIPASE, AMYLASE in the last 168 hours. No results for input(s): AMMONIA in the last 168 hours.  CBC: Recent Labs  Lab 02/08/21 0115 02/08/21 0318 02/08/21 0326 02/08/21 0636 02/08/21 0828 02/09/21 0551  WBC 7.5  --   --   --  14.9* 8.3  HGB 17.4* 18.0* 18.4* 16.3 16.0 14.8  HCT 56.6* 53.0* 54.0* 48.0 50.1 43.9  MCV 83.7  --   --   --  82.3 80.1  PLT 319  --   --   --  280 187    Cardiac Enzymes: No results for input(s): CKTOTAL, CKMB, CKMBINDEX, TROPONINI in the last 168 hours.  BNP: BNP (last 3 results) Recent Labs  02/08/21 0115  BNP 501.9*    ProBNP (last 3 results) No results for input(s): PROBNP in the last 8760 hours.    Other results:  Imaging: [place] EKG SITE RITE  Result Date: 02/09/2021 If Site Rite image not attached, placement could not be confirmed due to current cardiac rhythm.    Medications:     Scheduled Medications: . Chlorhexidine Gluconate Cloth  6 each Topical Daily  . digoxin  0.125 mg Oral Daily  . docusate sodium  100 mg Oral BID  . enoxaparin (LOVENOX) injection  40 mg Subcutaneous Q24H  . insulin aspart  0-15 Units Subcutaneous Q4H  . mouth rinse  15 mL Mouth Rinse BID  . sacubitril-valsartan  1 tablet Oral BID  . sodium chloride flush  10-40 mL Intracatheter Q12H  . spironolactone  25 mg Oral Daily    Infusions: . sodium chloride    . milrinone 0.25 mcg/kg/min (02/11/21 0743)    PRN Medications: chlorproMAZINE (THORAZINE) injection, docusate sodium, labetalol, polyethylene glycol, sodium chloride flush   Assessment/Plan:   1. Acute Hypoxic Respiratory Failure due to pulmonary edema/cardiogenic shock - Intubated 2/10 --> extubated 4 liters [place] - Lactic  acid 4.1>3>2.2 - Improved  2. Acute Systolic Heart Failure -> cardiogenic shock -Echo EF < 20% RV moderate HK . No previous history of coronary disease.   -HS Trop 26>45>173 -BNP 501  - Now on milrinone 0.25. Co-ox 72% CVP 3-4. Off lasix. Can turn milrinone down to 0.125 - Continue digoxin  - Continue spiro 25 - Continue Entresto 49/51 bid - PRN hydral  - R/L cath on Monday  3. Hypertensive Urgency  - Recently stopped ACEi due to cough - BP remains high. Med changes as above  4. DMII On ssi   5. AKI  - Cardiorenal/ATN - Creatinine on admit 1.3. Baseline < 1.  - resolved  6. Hypokalemia  - supp - continue spiro   Length of Stay: 3   Arvilla Meres MD 02/11/2021, 11:33 AM  Advanced Heart Failure Team Pager [phone] (M-F; 7a - 4p)  Please contact [facility] for night-coverage after hours (4p -7a ) and weekends on [email or web address]

## 2021-02-12 ENCOUNTER — Encounter (HOSPITAL_COMMUNITY): Payer: Self-pay | Admitting: Internal Medicine

## 2021-02-12 ENCOUNTER — Encounter (HOSPITAL_COMMUNITY)
Admission: EM | Disposition: A | Discharge status: Home / Self Care | Payer: Self-pay | Source: Home / Self Care | Attending: Internal Medicine

## 2021-02-12 DIAGNOSIS — J9601 Acute respiratory failure with hypoxia: Secondary | ICD-10-CM | POA: Diagnosis not present

## 2021-02-12 DIAGNOSIS — J810 Acute pulmonary edema: Secondary | ICD-10-CM | POA: Diagnosis not present

## 2021-02-12 DIAGNOSIS — E876 Hypokalemia: Secondary | ICD-10-CM | POA: Diagnosis not present

## 2021-02-12 DIAGNOSIS — I5021 Acute systolic (congestive) heart failure: Secondary | ICD-10-CM | POA: Diagnosis not present

## 2021-02-12 DIAGNOSIS — N179 Acute kidney failure, unspecified: Secondary | ICD-10-CM | POA: Diagnosis not present

## 2021-02-12 HISTORY — PX: RIGHT/LEFT HEART CATH AND CORONARY ANGIOGRAPHY: CATH118266

## 2021-02-12 LAB — POCT I-STAT 7, (LYTES, BLD GAS, ICA,H+H)
Acid-Base Excess: 2 mmol/L (ref 0.0–2.0)
Bicarbonate: 26.9 mmol/L (ref 20.0–28.0)
Calcium, Ion: 1.02 mmol/L — ABNORMAL LOW (ref 1.15–1.40)
HCT: 40 % (ref 39.0–52.0)
Hemoglobin: 13.6 g/dL (ref 13.0–17.0)
O2 Saturation: 97 %
Potassium: 3.6 mmol/L (ref 3.5–5.1)
Sodium: 142 mmol/L (ref 135–145)
TCO2: 28 mmol/L (ref 22–32)
pCO2 arterial: 40.1 mmHg (ref 32.0–48.0)
pH, Arterial: 7.434 (ref 7.350–7.450)
pO2, Arterial: 93 mmHg (ref 83.0–108.0)

## 2021-02-12 LAB — BASIC METABOLIC PANEL
Anion gap: 7 (ref 5–15)
BUN: 15 mg/dL (ref 8–23)
CO2: 26 mmol/L (ref 22–32)
Calcium: 8.4 mg/dL — ABNORMAL LOW (ref 8.9–10.3)
Chloride: 102 mmol/L (ref 98–111)
Creatinine, Ser: 0.78 mg/dL (ref 0.61–1.24)
GFR, Estimated: >60 mL/min (ref >60)
Glucose, Bld: 157 mg/dL — ABNORMAL HIGH (ref 70–99)
Potassium: 6.8 mmol/L (ref 3.5–5.1)
Sodium: 135 mmol/L (ref 135–145)

## 2021-02-12 LAB — POCT I-STAT EG7
Acid-Base Excess: 1 mmol/L (ref 0.0–2.0)
Acid-Base Excess: 4 mmol/L — ABNORMAL HIGH (ref 0.0–2.0)
Bicarbonate: 26.3 mmol/L (ref 20.0–28.0)
Bicarbonate: 29.5 mmol/L — ABNORMAL HIGH (ref 20.0–28.0)
Calcium, Ion: 0.92 mmol/L — ABNORMAL LOW (ref 1.15–1.40)
Calcium, Ion: 1.18 mmol/L (ref 1.15–1.40)
HCT: 39 % (ref 39.0–52.0)
HCT: 43 % (ref 39.0–52.0)
Hemoglobin: 13.3 g/dL (ref 13.0–17.0)
Hemoglobin: 14.6 g/dL (ref 13.0–17.0)
O2 Saturation: 70 %
O2 Saturation: 74 %
Potassium: 3.3 mmol/L — ABNORMAL LOW (ref 3.5–5.1)
Potassium: 4 mmol/L (ref 3.5–5.1)
Sodium: 140 mmol/L (ref 135–145)
Sodium: 143 mmol/L (ref 135–145)
TCO2: 28 mmol/L (ref 22–32)
TCO2: 31 mmol/L (ref 22–32)
pCO2, Ven: 42.1 mmHg — ABNORMAL LOW (ref 44.0–60.0)
pCO2, Ven: 46.4 mmHg (ref 44.0–60.0)
pH, Ven: 7.404 (ref 7.250–7.430)
pH, Ven: 7.411 (ref 7.250–7.430)
pO2, Ven: 37 mmHg (ref 32.0–45.0)
pO2, Ven: 39 mmHg (ref 32.0–45.0)

## 2021-02-12 LAB — COOXEMETRY PANEL
Carboxyhemoglobin: 0.9 % (ref 0.5–1.5)
Methemoglobin: 0.8 % (ref 0.0–1.5)
O2 Saturation: 69.9 %
Total hemoglobin: 13.8 g/dL (ref 12.0–16.0)

## 2021-02-12 LAB — CBC
HCT: 45.2 % (ref 39.0–52.0)
Hemoglobin: 14.3 g/dL (ref 13.0–17.0)
MCH: 25.8 pg — ABNORMAL LOW (ref 26.0–34.0)
MCHC: 31.6 g/dL (ref 30.0–36.0)
MCV: 81.6 fL (ref 80.0–100.0)
Platelets: 149 10*3/uL — ABNORMAL LOW (ref 150–400)
RBC: 5.54 MIL/uL (ref 4.22–5.81)
RDW: 13.7 % (ref 11.5–15.5)
WBC: 8.1 10*3/uL (ref 4.0–10.5)
nRBC: 0 % (ref 0.0–0.2)

## 2021-02-12 LAB — GLUCOSE, CAPILLARY
Glucose-Capillary: 119 mg/dL — ABNORMAL HIGH (ref 70–99)
Glucose-Capillary: 127 mg/dL — ABNORMAL HIGH (ref 70–99)
Glucose-Capillary: 144 mg/dL — ABNORMAL HIGH (ref 70–99)
Glucose-Capillary: 180 mg/dL — ABNORMAL HIGH (ref 70–99)
Glucose-Capillary: 186 mg/dL — ABNORMAL HIGH (ref 70–99)
Glucose-Capillary: 86 mg/dL (ref 70–99)

## 2021-02-12 LAB — POTASSIUM: Potassium: 4.1 mmol/L (ref 3.5–5.1)

## 2021-02-12 SURGERY — RIGHT/LEFT HEART CATH AND CORONARY ANGIOGRAPHY
Anesthesia: LOCAL

## 2021-02-12 MED ORDER — ENOXAPARIN SODIUM 40 MG/0.4ML ~~LOC~~ SOLN
40.0000 mg | SUBCUTANEOUS | Status: DC
  Administered 2021-02-13 – 2021-02-14 (×2): 40 mg via SUBCUTANEOUS
  Filled 2021-02-12 (×2): qty 0.4

## 2021-02-12 MED ORDER — HEPARIN (PORCINE) IN NACL 1000-0.9 UT/500ML-% IV SOLN
INTRAVENOUS | Status: AC
  Filled 2021-02-12: qty 1000

## 2021-02-12 MED ORDER — ONDANSETRON HCL 4 MG/2ML IJ SOLN: 4.0000 mg | Freq: Four times a day (QID) | INTRAMUSCULAR | Status: DC | PRN

## 2021-02-12 MED ORDER — IOHEXOL 350 MG/ML SOLN
INTRAVENOUS | Status: DC | PRN
  Administered 2021-02-12: 45 mL

## 2021-02-12 MED ORDER — VERAPAMIL HCL 2.5 MG/ML IV SOLN
INTRAVENOUS | Status: AC
  Filled 2021-02-12: qty 2

## 2021-02-12 MED ORDER — LIDOCAINE HCL (PF) 1 % IJ SOLN
INTRAMUSCULAR | Status: DC | PRN
  Administered 2021-02-12 (×2): 2 mL

## 2021-02-12 MED ORDER — HYDRALAZINE HCL 20 MG/ML IJ SOLN: 10.0000 mg | INTRAMUSCULAR | Status: AC | PRN

## 2021-02-12 MED ORDER — SACUBITRIL-VALSARTAN 97-103 MG PO TABS
1.0000 | ORAL_TABLET | Freq: Two times a day (BID) | ORAL | Status: DC
  Administered 2021-02-12 – 2021-02-14 (×4): 1 via ORAL
  Filled 2021-02-12 (×5): qty 1

## 2021-02-12 MED ORDER — SODIUM CHLORIDE 0.9 % IV SOLN: INTRAVENOUS | Status: AC

## 2021-02-12 MED ORDER — SODIUM CHLORIDE 0.9% FLUSH
3.0000 mL | Freq: Two times a day (BID) | INTRAVENOUS | Status: DC
  Administered 2021-02-12 – 2021-02-14 (×4): 3 mL via INTRAVENOUS

## 2021-02-12 MED ORDER — ACETAMINOPHEN 325 MG PO TABS: 650.0000 mg | ORAL_TABLET | ORAL | Status: DC | PRN

## 2021-02-12 MED ORDER — SACUBITRIL-VALSARTAN 49-51 MG PO TABS
1.0000 | ORAL_TABLET | Freq: Two times a day (BID) | ORAL | Status: DC
  Administered 2021-02-12: 1 via ORAL
  Filled 2021-02-12 (×2): qty 1

## 2021-02-12 MED ORDER — MIDAZOLAM HCL 2 MG/2ML IJ SOLN
INTRAMUSCULAR | Status: DC | PRN
  Administered 2021-02-12: 1 mg via INTRAVENOUS

## 2021-02-12 MED ORDER — FENTANYL CITRATE (PF) 100 MCG/2ML IJ SOLN
INTRAMUSCULAR | Status: AC
  Filled 2021-02-12: qty 2

## 2021-02-12 MED ORDER — VERAPAMIL HCL 2.5 MG/ML IV SOLN
INTRAVENOUS | Status: DC | PRN
  Administered 2021-02-12: 10 mL via INTRA_ARTERIAL

## 2021-02-12 MED ORDER — CARVEDILOL 3.125 MG PO TABS
3.1250 mg | ORAL_TABLET | Freq: Two times a day (BID) | ORAL | Status: DC
  Administered 2021-02-13 – 2021-02-14 (×3): 3.125 mg via ORAL
  Filled 2021-02-12 (×3): qty 1

## 2021-02-12 MED ORDER — HEPARIN SODIUM (PORCINE) 1000 UNIT/ML IJ SOLN
INTRAMUSCULAR | Status: DC | PRN
  Administered 2021-02-12: 4000 [IU] via INTRAVENOUS

## 2021-02-12 MED ORDER — SPIRONOLACTONE 25 MG PO TABS
25.0000 mg | ORAL_TABLET | Freq: Every day | ORAL | Status: DC
  Administered 2021-02-12 – 2021-02-14 (×3): 25 mg via ORAL
  Filled 2021-02-12 (×3): qty 1

## 2021-02-12 MED ORDER — LIDOCAINE HCL (PF) 1 % IJ SOLN
INTRAMUSCULAR | Status: AC
  Filled 2021-02-12: qty 30

## 2021-02-12 MED ORDER — HEPARIN (PORCINE) IN NACL 1000-0.9 UT/500ML-% IV SOLN
INTRAVENOUS | Status: DC | PRN
  Administered 2021-02-12 (×2): 500 mL

## 2021-02-12 MED ORDER — SODIUM CHLORIDE 0.9 % IV SOLN: 250.0000 mL | INTRAVENOUS | Status: DC | PRN

## 2021-02-12 MED ORDER — FENTANYL CITRATE (PF) 100 MCG/2ML IJ SOLN
INTRAMUSCULAR | Status: DC | PRN
  Administered 2021-02-12: 25 ug via INTRAVENOUS

## 2021-02-12 MED ORDER — SODIUM CHLORIDE 0.9% FLUSH: 3.0000 mL | INTRAVENOUS | Status: DC | PRN

## 2021-02-12 MED ORDER — WHITE PETROLATUM EX OINT
TOPICAL_OINTMENT | CUTANEOUS | Status: AC
  Filled 2021-02-12: qty 28.35

## 2021-02-12 MED ORDER — LABETALOL HCL 5 MG/ML IV SOLN: 10.0000 mg | INTRAVENOUS | Status: AC | PRN

## 2021-02-12 MED ORDER — MIDAZOLAM HCL 2 MG/2ML IJ SOLN
INTRAMUSCULAR | Status: AC
  Filled 2021-02-12: qty 2

## 2021-02-12 SURGICAL SUPPLY — 13 items
BAG SNAP BAND KOVER 36X36 (MISCELLANEOUS) ×1 IMPLANT
CATH 5FR JL3.5 JR4 ANG PIG MP (CATHETERS) ×1 IMPLANT
CATH BALLN WEDGE 5F 110CM (CATHETERS) ×1 IMPLANT
COVER DOME SNAP 22 D (MISCELLANEOUS) ×1 IMPLANT
DEVICE RAD COMP TR BAND LRG (VASCULAR PRODUCTS) ×1 IMPLANT
GLIDESHEATH SLEND SS 6F .021 (SHEATH) ×1 IMPLANT
GUIDEWIRE INQWIRE 1.5J.035X260 (WIRE) IMPLANT
INQWIRE 1.5J .035X260CM (WIRE) ×2
KIT MICROPUNCTURE NIT STIFF (SHEATH) ×2 IMPLANT
PACK CARDIAC CATHETERIZATION (CUSTOM PROCEDURE TRAY) ×2 IMPLANT
SHEATH GLIDE SLENDER 4/5FR (SHEATH) ×1 IMPLANT
SHEATH PROBE COVER 6X72 (BAG) ×1 IMPLANT
TRANSDUCER W/STOPCOCK (MISCELLANEOUS) ×2 IMPLANT

## 2021-02-12 NOTE — Progress Notes (Addendum)
TR Band removed, 2x2 gauze and tegaderm applied, no bleeding nor hematoma noted, continue to monitor.

## 2021-02-12 NOTE — Plan of Care (Signed)

## 2021-02-12 NOTE — H&P (View-Only) (Signed)
Advanced Heart Failure Rounding Note   Subjective:    On milrinone 0.125. Co-ox 70%.   Off lasix w/ low CVPs yesterday. Wt down another 4 lb. Unable to get good CVP reading/waveform this morning.   K 6.8 on BMP. Labs repeated and K 4.1. Earlier collection likely hemolyzed   He is laying flat in bed w/o complaints of dyspnea/ orthopnea.   On for [facility] today.    Objective:   Weight Range:  Vital Signs:   Temp:  [97.5 F (36.4 C)-98.8 F (37.1 C)] 98.2 F (36.8 C) (02/14 0358) Pulse Rate:  [94-98] 94 (02/14 0358) Resp:  [18-21] 21 (02/14 0358) BP: (120-146)/(60-92) 146/90 (02/14 0358) SpO2:  [96 %-98 %] 96 % (02/14 0358) Weight:  [79.7 kg] 79.7 kg (02/14 0358) Last BM Date: 02/11/21  Weight change: Filed Weights   02/09/21 0015 02/10/21 0200 02/12/21 0358  Weight: 80.3 kg 81.6 kg 79.7 kg    Intake/Output:   Intake/Output Summary (Last 24 hours) at 02/12/2021 0757 Last data filed at 02/12/2021 0630 Gross per 24 hour  Intake 667 ml  Output 1450 ml  Net -783 ml     Physical Exam: General:  Well appearing, laying flat in bed w/o resp difficulty HEENT: normal Neck: supple. no JVD. Carotids 2+ bilat; no bruits. No lymphadenopathy or thryomegaly appreciated. Cor: PMI nondisplaced. Regular rhythm and tachy rate. No rubs, gallops or murmurs. Lungs: clear. No wheezing  Abdomen: soft, nontender, nondistended. No hepatosplenomegaly. No bruits or masses. Good bowel sounds. Extremities: no cyanosis, clubbing, rash, edema + RUE PICC  Neuro: alert & orientedx3, cranial nerves grossly intact. moves all 4 extremities w/o difficulty. Affect pleasant   Telemetry: Sinus 90-100 Personally reviewed   Labs: Basic Metabolic Panel: Recent Labs  Lab 02/08/21 0308 02/08/21 0318 02/08/21 0636 02/09/21 0551 02/10/21 0327 02/11/21 0352 02/12/21 0435 02/12/21 0554  NA 138   < > 140 143 138 139 135  --   K 3.8   < > 4.2 3.9 3.2* 3.0* 6.8* 4.1  CL 101  --   --  107 102 97*  102  --   CO2 23  --   --  18* 27 30 26   --   GLUCOSE 279*  --   --  129* 164* 127* 157*  --   BUN 23  --   --  27* 22 14 15   --   CREATININE 1.29*  --   --  1.28* 1.08 1.06 0.78  --   CALCIUM 9.2  --   --  9.0 8.6* 8.7* 8.4*  --   MG 2.2  --   --   --   --   --   --   --   PHOS 5.6*  --   --   --   --   --   --   --    < > = values in this interval not displayed.    Liver Function Tests: Recent Labs  Lab 02/08/21 0115  AST 47*  ALT 51*  ALKPHOS 76  BILITOT 1.0  PROT 7.4  ALBUMIN 4.0   No results for input(s): LIPASE, AMYLASE in the last 168 hours. No results for input(s): AMMONIA in the last 168 hours.  CBC: Recent Labs  Lab 02/08/21 0115 02/08/21 0318 02/08/21 0326 02/08/21 0636 02/08/21 0828 02/09/21 0551  WBC 7.5  --   --   --  14.9* 8.3  HGB 17.4* 18.0* 18.4* 16.3 16.0 14.8  HCT 56.6*  53.0* 54.0* 48.0 50.1 43.9  MCV 83.7  --   --   --  82.3 80.1  PLT 319  --   --   --  280 187    Cardiac Enzymes: No results for input(s): CKTOTAL, CKMB, CKMBINDEX, TROPONINI in the last 168 hours.  BNP: BNP (last 3 results) Recent Labs    02/08/21 0115  BNP 501.9*    ProBNP (last 3 results) No results for input(s): PROBNP in the last 8760 hours.    Other results:  Imaging: No results found.   Medications:     Scheduled Medications: . Chlorhexidine Gluconate Cloth  6 each Topical Daily  . digoxin  0.125 mg Oral Daily  . docusate sodium  100 mg Oral BID  . enoxaparin (LOVENOX) injection  40 mg Subcutaneous Q24H  . insulin aspart  0-15 Units Subcutaneous Q4H  . mouth rinse  15 mL Mouth Rinse BID  . sacubitril-valsartan  1 tablet Oral BID  . sodium chloride flush  10-40 mL Intracatheter Q12H  . sodium chloride flush  3 mL Intravenous Q12H  . spironolactone  25 mg Oral Daily    Infusions: . sodium chloride    . sodium chloride    . sodium chloride    . milrinone 0.125 mcg/kg/min (02/11/21 1646)    PRN Medications: sodium chloride, chlorproMAZINE  (THORAZINE) injection, docusate sodium, labetalol, polyethylene glycol, sodium chloride flush, sodium chloride flush   Assessment/Plan:   1. Acute Hypoxic Respiratory Failure due to pulmonary edema/cardiogenic shock - Intubated 2/10 --> extubated 4 liters [place] - Lactic acid 4.1>3>2.2 - Improved  2. Acute Systolic Heart Failure -> cardiogenic shock -Echo EF < 20% RV moderate HK . No previous history of coronary disease.   -HS Trop 26>45>173 -BNP 501  - Now on milrinone 0.125. Co-ox 70%  - off diuretics for low CVPs. Volume stable on exam  - Continue digoxin 0.125  - Continue spiro 25 - Continue Entresto 49/51 bid - PRN hydral  - R/L cath today   3. Hypertensive Urgency  - Recently stopped ACEi due to cough - BP better today, 133/96 (prior to am meds)  4. DMII On ssi   5. AKI  - Cardiorenal/ATN - Creatinine on admit 1.3. Baseline < 1.  - resolved  6. Hypokalemia  - K 4.1 today  - supp - continue spiro    Length of Stay: 4   Brittainy Simmons PA-C  02/12/2021, 7:57 AM  Advanced Heart Failure Team Pager [phone] (M-F; 7a - 4p)  Please contact [facility] for night-coverage after hours (4p -7a ) and weekends on [email or web address]  Patient seen and examined with the above-signed Advanced Practice Provider and/or Housestaff. I personally reviewed laboratory data, imaging studies and relevant notes. I independently examined the patient and formulated the important aspects of the plan. I have edited the note to reflect any of my changes or salient points. I have personally discussed the plan with the patient and/or family.  BP and volume status much improved. No orthopnea or PND.   General:  Well appearing. No resp difficulty HEENT: normal Neck: supple. no JVD. Carotids 2+ bilat; no bruits. No lymphadenopathy or thryomegaly appreciated. Cor: PMI nondisplaced. Regular rate & rhythm. No rubs, gallops or murmurs. Lungs: clear Abdomen: soft, nontender, nondistended. No  hepatosplenomegaly. No bruits or masses. Good bowel sounds. Extremities: no cyanosis, clubbing, rash, edema Neuro: alert & orientedx3, cranial nerves grossly intact. moves all 4 extremities w/o difficulty. Affect pleasant   For R/L heart cath   today followed by possible cMRI. Procedures discussed.   Arvilla Meres, MD  8:55 AM

## 2021-02-12 NOTE — Progress Notes (Signed)
Transported to the cath lab by bed awake and alert. Still with continuous hiccup thorazine im given,.

## 2021-02-12 NOTE — Progress Notes (Addendum)
Advanced Heart Failure Rounding Note   Subjective:    On milrinone 0.125. Co-ox 70%.   Off lasix w/ low CVPs yesterday. Wt down another 4 lb. Unable to get good CVP reading/waveform this morning.   K 6.8 on BMP. Labs repeated and K 4.1. Earlier collection likely hemolyzed   He is laying flat in bed w/o complaints of dyspnea/ orthopnea.   On for [facility] today.    Objective:   Weight Range:  Vital Signs:   Temp:  [97.5 F (36.4 C)-98.8 F (37.1 C)] 98.2 F (36.8 C) (02/14 0358) Pulse Rate:  [94-98] 94 (02/14 0358) Resp:  [18-21] 21 (02/14 0358) BP: (120-146)/(60-92) 146/90 (02/14 0358) SpO2:  [96 %-98 %] 96 % (02/14 0358) Weight:  [79.7 kg] 79.7 kg (02/14 0358) Last BM Date: 02/11/21  Weight change: Filed Weights   02/09/21 0015 02/10/21 0200 02/12/21 0358  Weight: 80.3 kg 81.6 kg 79.7 kg    Intake/Output:   Intake/Output Summary (Last 24 hours) at 02/12/2021 0757 Last data filed at 02/12/2021 0630 Gross per 24 hour  Intake 667 ml  Output 1450 ml  Net -783 ml     Physical Exam: General:  Well appearing, laying flat in bed w/o resp difficulty HEENT: normal Neck: supple. no JVD. Carotids 2+ bilat; no bruits. No lymphadenopathy or thryomegaly appreciated. Cor: PMI nondisplaced. Regular rhythm and tachy rate. No rubs, gallops or murmurs. Lungs: clear. No wheezing  Abdomen: soft, nontender, nondistended. No hepatosplenomegaly. No bruits or masses. Good bowel sounds. Extremities: no cyanosis, clubbing, rash, edema + RUE PICC  Neuro: alert & orientedx3, cranial nerves grossly intact. moves all 4 extremities w/o difficulty. Affect pleasant   Telemetry: Sinus 90-100 Personally reviewed   Labs: Basic Metabolic Panel: Recent Labs  Lab 02/08/21 0308 02/08/21 0318 02/08/21 0636 02/09/21 0551 02/10/21 0327 02/11/21 0352 02/12/21 0435 02/12/21 0554  NA 138   < > 140 143 138 139 135  --   K 3.8   < > 4.2 3.9 3.2* 3.0* 6.8* 4.1  CL 101  --   --  107 102 97*  102  --   CO2 23  --   --  18* 27 30 26   --   GLUCOSE 279*  --   --  129* 164* 127* 157*  --   BUN 23  --   --  27* 22 14 15   --   CREATININE 1.29*  --   --  1.28* 1.08 1.06 0.78  --   CALCIUM 9.2  --   --  9.0 8.6* 8.7* 8.4*  --   MG 2.2  --   --   --   --   --   --   --   PHOS 5.6*  --   --   --   --   --   --   --    < > = values in this interval not displayed.    Liver Function Tests: Recent Labs  Lab 02/08/21 0115  AST 47*  ALT 51*  ALKPHOS 76  BILITOT 1.0  PROT 7.4  ALBUMIN 4.0   No results for input(s): LIPASE, AMYLASE in the last 168 hours. No results for input(s): AMMONIA in the last 168 hours.  CBC: Recent Labs  Lab 02/08/21 0115 02/08/21 0318 02/08/21 0326 02/08/21 0636 02/08/21 0828 02/09/21 0551  WBC 7.5  --   --   --  14.9* 8.3  HGB 17.4* 18.0* 18.4* 16.3 16.0 14.8  HCT 56.6*  53.0* 54.0* 48.0 50.1 43.9  MCV 83.7  --   --   --  82.3 80.1  PLT 319  --   --   --  280 187    Cardiac Enzymes: No results for input(s): CKTOTAL, CKMB, CKMBINDEX, TROPONINI in the last 168 hours.  BNP: BNP (last 3 results) Recent Labs    02/08/21 0115  BNP 501.9*    ProBNP (last 3 results) No results for input(s): PROBNP in the last 8760 hours.    Other results:  Imaging: No results found.   Medications:     Scheduled Medications: . Chlorhexidine Gluconate Cloth  6 each Topical Daily  . digoxin  0.125 mg Oral Daily  . docusate sodium  100 mg Oral BID  . enoxaparin (LOVENOX) injection  40 mg Subcutaneous Q24H  . insulin aspart  0-15 Units Subcutaneous Q4H  . mouth rinse  15 mL Mouth Rinse BID  . sacubitril-valsartan  1 tablet Oral BID  . sodium chloride flush  10-40 mL Intracatheter Q12H  . sodium chloride flush  3 mL Intravenous Q12H  . spironolactone  25 mg Oral Daily    Infusions: . sodium chloride    . sodium chloride    . sodium chloride    . milrinone 0.125 mcg/kg/min (02/11/21 1646)    PRN Medications: sodium chloride, chlorproMAZINE  (THORAZINE) injection, docusate sodium, labetalol, polyethylene glycol, sodium chloride flush, sodium chloride flush   Assessment/Plan:   1. Acute Hypoxic Respiratory Failure due to pulmonary edema/cardiogenic shock - Intubated 2/10 --> extubated 4 liters [place] - Lactic acid 4.1>3>2.2 - Improved  2. Acute Systolic Heart Failure -> cardiogenic shock -Echo EF < 20% RV moderate HK . No previous history of coronary disease.   -HS Trop 26>45>173 -BNP 501  - Now on milrinone 0.125. Co-ox 70%  - off diuretics for low CVPs. Volume stable on exam  - Continue digoxin 0.125  - Continue spiro 25 - Continue Entresto 49/51 bid - PRN hydral  - R/L cath today   3. Hypertensive Urgency  - Recently stopped ACEi due to cough - BP better today, 133/96 (prior to am meds)  4. DMII On ssi   5. AKI  - Cardiorenal/ATN - Creatinine on admit 1.3. Baseline < 1.  - resolved  6. Hypokalemia  - K 4.1 today  - supp - continue spiro    Length of Stay: 4   Brittainy Simmons PA-C  02/12/2021, 7:57 AM  Advanced Heart Failure Team Pager [phone] (M-F; 7a - 4p)  Please contact [facility] for night-coverage after hours (4p -7a ) and weekends on [email or web address]  Patient seen and examined with the above-signed Advanced Practice Provider and/or Housestaff. I personally reviewed laboratory data, imaging studies and relevant notes. I independently examined the patient and formulated the important aspects of the plan. I have edited the note to reflect any of my changes or salient points. I have personally discussed the plan with the patient and/or family.  BP and volume status much improved. No orthopnea or PND.   General:  Well appearing. No resp difficulty HEENT: normal Neck: supple. no JVD. Carotids 2+ bilat; no bruits. No lymphadenopathy or thryomegaly appreciated. Cor: PMI nondisplaced. Regular rate & rhythm. No rubs, gallops or murmurs. Lungs: clear Abdomen: soft, nontender, nondistended. No  hepatosplenomegaly. No bruits or masses. Good bowel sounds. Extremities: no cyanosis, clubbing, rash, edema Neuro: alert & orientedx3, cranial nerves grossly intact. moves all 4 extremities w/o difficulty. Affect pleasant   For R/L heart cath  today followed by possible cMRI. Procedures discussed.   Arvilla Meres, MD  8:55 AM

## 2021-02-12 NOTE — Progress Notes (Signed)
Still with continuous hiccup despite thorazine im given early.

## 2021-02-12 NOTE — Interval H&P Note (Signed)
History and Physical Interval Note:  02/12/2021 8:56 AM  Devin Goodman  has presented today for surgery, with the diagnosis of HF.  The various methods of treatment have been discussed with the patient and family. After consideration of risks, benefits and other options for treatment, the patient has consented to  Procedure(s): RIGHT/LEFT HEART CATH AND CORONARY ANGIOGRAPHY (N/A) and possible coronary angioplasty as a surgical intervention.  The patient's history has been reviewed, patient examined, no change in status, stable for surgery.  I have reviewed the patient's chart and labs.  Questions were answered to the patient's satisfaction.     Makalah Asberry

## 2021-02-12 NOTE — Progress Notes (Signed)
Prior K off PICC line. ECG was fine and patient without any complaints. L arm free stick with K (4.1). Resuming entresto/spiro now.

## 2021-02-12 NOTE — Progress Notes (Signed)
CRITICAL VALUE ALERT  Critical Value: Potassium 6.8  Date & Time Notified:  02/12/2021, 0525  Provider Notified: Dr. Cherly Beach  Orders Received/Actions taken: Repeat lab with free stick and obtain ECG.

## 2021-02-12 NOTE — Progress Notes (Signed)
Offered Thorazine for hiccup but refused.

## 2021-02-12 NOTE — Progress Notes (Signed)
Back from the cath lab by bed sleepy but arousal. Instructed to avoid moving right  And left arm. Right arm elevated with pillow, TR Band in placed.

## 2021-02-12 NOTE — Progress Notes (Addendum)
Paged for alert K (6.8). Called nursing, no recent K given, they rechecked through PICC line, both labs obtained through it (7.0). Held spiro and entresto. Recheck free stick, K has been sent to lab. Patient asx, ECG without signs of hyperK. Cleda Daub started on 02/12, entresto started as well on 02/12. S/p K 40 PO yesterday as well as K 40 IV. K was (3.0) yesterday morning. Will wait for recheck to result shortly before treating. He was being actively diuresed the past several days and is not currently. Even with medication additions and K supplementation I would be surprised if he went from (3.0) to (6.8). Will follow up and make changes prior to leaving today.

## 2021-02-13 ENCOUNTER — Inpatient Hospital Stay (HOSPITAL_COMMUNITY): Payer: Medicare Other

## 2021-02-13 DIAGNOSIS — J9601 Acute respiratory failure with hypoxia: Secondary | ICD-10-CM | POA: Diagnosis not present

## 2021-02-13 DIAGNOSIS — J810 Acute pulmonary edema: Secondary | ICD-10-CM | POA: Diagnosis not present

## 2021-02-13 DIAGNOSIS — E876 Hypokalemia: Secondary | ICD-10-CM | POA: Diagnosis not present

## 2021-02-13 DIAGNOSIS — I514 Myocarditis, unspecified: Secondary | ICD-10-CM

## 2021-02-13 DIAGNOSIS — N179 Acute kidney failure, unspecified: Secondary | ICD-10-CM | POA: Diagnosis not present

## 2021-02-13 LAB — COOXEMETRY PANEL
Carboxyhemoglobin: 1.1 % (ref 0.5–1.5)
Methemoglobin: 0.8 % (ref 0.0–1.5)
O2 Saturation: 70 %
Total hemoglobin: 14.6 g/dL (ref 12.0–16.0)

## 2021-02-13 LAB — GLUCOSE, CAPILLARY
Glucose-Capillary: 113 mg/dL — ABNORMAL HIGH (ref 70–99)
Glucose-Capillary: 131 mg/dL — ABNORMAL HIGH (ref 70–99)
Glucose-Capillary: 147 mg/dL — ABNORMAL HIGH (ref 70–99)
Glucose-Capillary: 196 mg/dL — ABNORMAL HIGH (ref 70–99)
Glucose-Capillary: 210 mg/dL — ABNORMAL HIGH (ref 70–99)
Glucose-Capillary: 82 mg/dL (ref 70–99)

## 2021-02-13 LAB — BASIC METABOLIC PANEL
Anion gap: 9 (ref 5–15)
BUN: 14 mg/dL (ref 8–23)
CO2: 27 mmol/L (ref 22–32)
Calcium: 9 mg/dL (ref 8.9–10.3)
Chloride: 99 mmol/L (ref 98–111)
Creatinine, Ser: 0.78 mg/dL (ref 0.61–1.24)
GFR, Estimated: >60 mL/min (ref >60)
Glucose, Bld: 143 mg/dL — ABNORMAL HIGH (ref 70–99)
Potassium: 4.1 mmol/L (ref 3.5–5.1)
Sodium: 135 mmol/L (ref 135–145)

## 2021-02-13 MED ORDER — ISOSORB DINITRATE-HYDRALAZINE 20-37.5 MG PO TABS
0.5000 | ORAL_TABLET | Freq: Three times a day (TID) | ORAL | Status: DC
  Administered 2021-02-13 – 2021-02-14 (×4): 0.5 via ORAL
  Filled 2021-02-13 (×4): qty 1

## 2021-02-13 MED ORDER — GADOBUTROL 1 MMOL/ML IV SOLN
10.0000 mL | Freq: Once | INTRAVENOUS | Status: AC | PRN
  Administered 2021-02-13: 10 mL via INTRAVENOUS

## 2021-02-13 MED ORDER — ALUM & MAG HYDROXIDE-SIMETH 200-200-20 MG/5ML PO SUSP
30.0000 mL | ORAL | Status: DC | PRN
  Administered 2021-02-13 – 2021-02-14 (×2): 30 mL via ORAL
  Filled 2021-02-13 (×2): qty 30

## 2021-02-13 MED ORDER — GABAPENTIN 300 MG PO CAPS
300.0000 mg | ORAL_CAPSULE | Freq: Two times a day (BID) | ORAL | Status: DC
  Administered 2021-02-13 – 2021-02-14 (×3): 300 mg via ORAL
  Filled 2021-02-13 (×3): qty 1

## 2021-02-13 NOTE — Progress Notes (Signed)
Brought back from MRI  , procedure not done due to schedule issue. To take him back after lunch,served.

## 2021-02-13 NOTE — Progress Notes (Addendum)
Advanced Heart Failure Rounding Note   Subjective:    R/LHC yesterday showed normal cors and well compensated hemodynamics on milrinone 0.125.   Findings:  On milrinone 0.125 mcg/kg/min  RA = 2  RV = 30/2 PA = 31/8 (17) PCW = 5 Fick cardiac output/index = 5.3/2.6 PVR = 2.3 WU Ao sat = 97% PA sat = 70%, 74%  Milrinone discontinued yesterday. Co-ox remains stable at 70%. SBPs elevated.   Wt down an additional 2 lb.  CVP 2   Awaiting cMRI.  Dyspnea resolved. Continues w/ hiccups.    Objective:   Weight Range:  Vital Signs:   Temp:  [97.3 F (36.3 C)-98.5 F (36.9 C)] 97.3 F (36.3 C) (02/15 0751) Pulse Rate:  [82-113] 90 (02/15 0751) Resp:  [13-25] 18 (02/15 0751) BP: (129-206)/(72-106) 162/93 (02/15 0751) SpO2:  [92 %-99 %] 96 % (02/15 0751) Weight:  [78.7 kg] 78.7 kg (02/15 0500) Last BM Date: 02/11/21  Weight change: Filed Weights   02/10/21 0200 02/12/21 0358 02/13/21 0500  Weight: 81.6 kg 79.7 kg 78.7 kg    Intake/Output:   Intake/Output Summary (Last 24 hours) at 02/13/2021 0902 Last data filed at 02/12/2021 1702 Gross per 24 hour  Intake 390 ml  Output -  Net 390 ml     PHYSICAL EXAM: CVP 2  General:  Well appearing, sitting up in bed. No respiratory difficulty, frequent hiccups  HEENT: normal Neck: supple. no JVD. Carotids 2+ bilat; no bruits. No lymphadenopathy or thyromegaly appreciated. Cor: PMI nondisplaced. Regular rate & rhythm. No rubs, gallops or murmurs. Lungs: clear Abdomen: soft, nontender, nondistended. No hepatosplenomegaly. No bruits or masses. Good bowel sounds. Extremities: no cyanosis, clubbing, rash, edema + RUE PICC  Neuro: alert & oriented x 3, cranial nerves grossly intact. moves all 4 extremities w/o difficulty. Affect pleasant.   Telemetry: NSR 80s Personally reviewed   Labs: Basic Metabolic Panel: Recent Labs  Lab 02/08/21 0308 02/08/21 0318 02/09/21 0551 02/10/21 0327 02/11/21 0352 02/12/21 0435  02/12/21 0554 02/12/21 0946 02/12/21 0950 02/12/21 0951 02/13/21 0436  NA 138   < > 143 138 139 135  --  142 143 140 135  K 3.8   < > 3.9 3.2* 3.0* 6.8* 4.1 3.6 3.3* 4.0 4.1  CL 101  --  107 102 97* 102  --   --   --   --  99  CO2 23  --  18* 27 30 26   --   --   --   --  27  GLUCOSE 279*  --  129* 164* 127* 157*  --   --   --   --  143*  BUN 23  --  27* 22 14 15   --   --   --   --  14  CREATININE 1.29*  --  1.28* 1.08 1.06 0.78  --   --   --   --  0.78  CALCIUM 9.2  --  9.0 8.6* 8.7* 8.4*  --   --   --   --  9.0  MG 2.2  --   --   --   --   --   --   --   --   --   --   PHOS 5.6*  --   --   --   --   --   --   --   --   --   --    < > = values in  this interval not displayed.    Liver Function Tests: Recent Labs  Lab 02/08/21 0115  AST 47*  ALT 51*  ALKPHOS 76  BILITOT 1.0  PROT 7.4  ALBUMIN 4.0   No results for input(s): LIPASE, AMYLASE in the last 168 hours. No results for input(s): AMMONIA in the last 168 hours.  CBC: Recent Labs  Lab 02/08/21 0115 02/08/21 0318 02/08/21 0828 02/09/21 0551 02/12/21 0946 02/12/21 0950 02/12/21 0951 02/12/21 1038  WBC 7.5  --  14.9* 8.3  --   --   --  8.1  HGB 17.4*   < > 16.0 14.8 13.6 13.3 14.6 14.3  HCT 56.6*   < > 50.1 43.9 40.0 39.0 43.0 45.2  MCV 83.7  --  82.3 80.1  --   --   --  81.6  PLT 319  --  280 187  --   --   --  149*   < > = values in this interval not displayed.    Cardiac Enzymes: No results for input(s): CKTOTAL, CKMB, CKMBINDEX, TROPONINI in the last 168 hours.  BNP: BNP (last 3 results) Recent Labs    02/08/21 0115  BNP 501.9*    ProBNP (last 3 results) No results for input(s): PROBNP in the last 8760 hours.    Other results:  Imaging: CARDIAC CATHETERIZATION  Result Date: 02/12/2021 Findings: On milrinone 0.125 mcg/kg/min RA = 2 RV = 30/2 PA = 31/8 (17) PCW = 5 Fick cardiac output/index = 5.3/2.6 PVR = 2.3 WU Ao sat = 97% PA sat = 70%, 74% Assessment: 1. Normal coronary arteries 2. Severe  NICM EF ~20% likely due to HTN 3. Well-compensated hemodynamics on milrinone 0.125 Plan/Discussion: Wean milrinone. Titrate GDMT. Check cMRI. Arvilla Meres, MD 10:07 AM     Medications:     Scheduled Medications: . carvedilol  3.125 mg Oral BID WC  . Chlorhexidine Gluconate Cloth  6 each Topical Daily  . digoxin  0.125 mg Oral Daily  . docusate sodium  100 mg Oral BID  . enoxaparin (LOVENOX) injection  40 mg Subcutaneous Q24H  . insulin aspart  0-15 Units Subcutaneous Q4H  . mouth rinse  15 mL Mouth Rinse BID  . sacubitril-valsartan  1 tablet Oral BID  . sodium chloride flush  10-40 mL Intracatheter Q12H  . sodium chloride flush  3 mL Intravenous Q12H  . spironolactone  25 mg Oral Daily    Infusions: . sodium chloride    . sodium chloride      PRN Medications: sodium chloride, acetaminophen, chlorproMAZINE (THORAZINE) injection, docusate sodium, labetalol, ondansetron (ZOFRAN) IV, polyethylene glycol, sodium chloride flush, sodium chloride flush   Assessment/Plan:   1. Acute Hypoxic Respiratory Failure due to pulmonary edema/cardiogenic shock - Intubated 2/10 --> extubated 4 liters [place] - Lactic acid 4.1>3>2.2 - Improved  2. Acute Systolic Heart Failure -> cardiogenic shock -Echo EF < 20% RV moderate HK . No previous history of coronary disease.   -HS Trop 79>89>211 -BNP 501  - R/LHC this admit w/ normal cors, Severe NICM EF ~20% likely due to HTN, well-compensated hemodynamics on milrinone 0.125. CI 2.6 - now off milrinone w/ stable co-ox 70% today  - CVP low 2, no loop diuretics currently  - Plan cMRI today  - Continue digoxin 0.125  - Continue spiro 25 mg  - Continue Entresto 97-103 bid - Continue Coreg 3.125 mg bid  - Add Bidil 1/2 tablet tid  - eventual SGLT2i  3. Hypertensive Urgency  - Recently  stopped ACEi due to cough - BP remains elevated today  - continue meds per above - add Bidil   4. DMII - On ssi  - hgb A1c 6.0  - would benefit from SGLT2i  w/ concomitant systolic heart failure   5. AKI  - Cardiorenal/ATN - Creatinine on admit 1.3. Baseline < 1.  - resolved  6. Hypokalemia  - K 4.1 today  - supp - continue spiro    Length of Stay: 5   Brittainy Simmons PA-C  02/13/2021, 9:02 AM  Advanced Heart Failure Team Pager [phone] (M-F; 7a - 4p)  Please contact [facility] for night-coverage after hours (4p -7a ) and weekends on [email or web address]  Patient seen and examined with the above-signed Advanced Practice Provider and/or Housestaff. I personally reviewed laboratory data, imaging studies and relevant notes. I independently examined the patient and formulated the important aspects of the plan. I have edited the note to reflect any of my changes or salient points. I have personally discussed the plan with the patient and/or family.  Cath results reviewed with him. Breathing better but still with sever hiccups. Very fatigued. No orthopnea or PND. BP trending back up.   General:  Fatigued appearing. + hiccups No resp difficulty HEENT: normal Neck: supple. no JVD. Carotids 2+ bilat; no bruits. No lymphadenopathy or thryomegaly appreciated. Cor: PMI nondisplaced. Regular rate & rhythm. No rubs, gallops or murmurs. Lungs: clear Abdomen: soft, nontender, nondistended. No hepatosplenomegaly. No bruits or masses. Good bowel sounds. Extremities: no cyanosis, clubbing, rash, edema Neuro: alert & orientedx3, cranial nerves grossly intact. moves all 4 extremities w/o difficulty. Affect pleasant  BP still high, Agree with adding Bidil. Suspect HTN cardiomyopathy. Plan cMRI today. Start gabapentin for hiccups. Mobilize.   Arvilla Meres, MD  10:08 AM

## 2021-02-13 NOTE — Progress Notes (Signed)
1337 Came to see pt before lunch and he was gone to MRI.  Returned now to see and pt being taken to MRI. Will continue to follow pt. Luetta Nutting RN BSN 02/13/2021 1:38 PM

## 2021-02-13 NOTE — Progress Notes (Signed)
With on and off hiccup,  refused thorazine .

## 2021-02-13 NOTE — Progress Notes (Signed)
Hiccup stopped for few min. after giving the initial dose of  neurontin , but came back up. Continue to monitor.

## 2021-02-13 NOTE — TOC Benefit Eligibility Note (Signed)
Transition of Care [facility]) Benefit Eligibility Note    Patient Details  Name: Devin Goodman MRN: [id number] Date of Birth: 12-Nov-1956   Medication/Dose: Sherryll Burger 97mg . - 103mg . bid for 30 day supply  Covered?: Yes  Tier: 2 Drug  Prescription Coverage [facility]  Spoke with Person/Company/Phone Number:: . [facility] [employer],PH# [phone]  Co-Pay: $30.00  Prior Approval: No  Deductible: Unmet       Emmit Alexanders Phone Number: 02/13/2021, 2:15 PM

## 2021-02-13 NOTE — Discharge Summary (Addendum)
Advanced Heart Failure Team  Discharge Summary   Patient ID: Devin Goodman MRN: [id number], DOB/AGE: [birth year] 65 y.o. Admit date: 02/08/2021 D/C date:     02/14/2021   Primary Discharge Diagnoses:  Acute Hypoxic Respiratory Failure Requiring Intubation  Acute Systolic Heart Failure>>Cardiogenic Shock  Pulmonary Edema Hypertensive Emergency  Hypertension, severe Acute Kidney Injury Hiccups   DM2   Hospital Course:  65 year old retired Engineer, civil (consulting) with a history of DMII, hyperlipidemia, and hypertension. Recently stopped ACEi due to cough.   Presented to [facility] ED on 2/10 with increased shortness of breath. On arrival oxygen sats low and SBP > 220. Initially on Bipap but sats remained low. Urgently intubated in the ED. Started on nitro drip. CTA- negative for PE, diffuse ground glass opacity, and large areas of consolidation in the lower lungs with possible ARDs. CXR concerning for pulmonary edema.    HS Trop [id number].  Lactic acid 4.1.  SARS 2 negative. Blood cultures obtained. Started on empirc antibiotics for possible PNA and given 40 mg IV lasix. Echo completed and showed severely reduced LVEF <20%, normal RV, and small pericardial effusion.    Initially on nitro drip but pressure dropped so neo was started. Neo later stopped. Ultimately placed on milrinone for cardiogenic shock and diuresed w/ IV Lasix. GDMT initiated. Blood cultures remained negative and respiratory status improved. Extubated 2/11 and abx discontinued.  R/LHC, while on 0.125 of milrinone, showed normal cors and well compensated hemodynamics. CI 2.6. findings c/w NICM, felt most likely 2/2 HTN.   cMRI difficult read with hiccups but felt to be NICM. He was weaned off of milrinone w/ stable co-ox and BP improved w/ GDMT. Also developed frequent hiccups and was started on gabapentin. Plan to continue twice a day for now. Reassess at his follow up.   Last seen and examined by Dr. Gala Romney on 02/14/21  and felt stable for d/c  home. Plan to follow closely in the HF clinic. All meds sent to [facility] TOC.     _________________________________ Cardiac Studies   2D Echo 02/08/21 1. Left ventricular ejection fraction, by estimation, is <20%. The left ventricle has severely decreased function. The left ventricle demonstrates global hypokinesis. The left ventricular internal cavity size was moderately dilated. There is moderate concentric left ventricular hypertrophy. Left ventricular diastolic parameters are indeterminate. 2. Right ventricular systolic function is moderately reduced. The right ventricular size is normal. 3. Left atrial size was mildly dilated. 4. A small pericardial effusion is present. Large pleural effusion in both left and right lateral regions. 5. The mitral valve is normal in structure. Mild mitral valve regurgitation. 6. The aortic valve was not well visualized. There is mild calcification of the aortic valve. Aortic valve regurgitation is not visualized. Mild aortic valve sclerosis is present, with no evidence of aortic valve stenosis.  R/LHC 02/12/21 Findings:  On milrinone 0.125 mcg/kg/min  RA = 2  RV = 30/2 PA = 31/8 (17) PCW = 5 Fick cardiac output/index = 5.3/2.6 PVR = 2.3 WU Ao sat = 97% PA sat = 70%, 74%  Assessment:  1. Normal coronary arteries 2. Severe NICM EF ~20% likely due to HTN 3. Well-compensated hemodynamics on milrinone 0.125  cMRI 02/13/21 1. Technically difficult study as patient had hiccups throughout study and was unable to do breath holds  2. Unable to quantify volumes due to respiratory motion artifact but visually appeared severe LV systolic dysfunction and moderate RV systolic dysfunction  3. Regional elevation in native T1, T2, and ECV  consistent with myocardial edema. Basal septal midwall LGE, which is a scar pattern seen in nonischemic cardiomyopathies. These findings suggest acute myocarditis.    Discharge Vitals: Blood pressure 104/70, pulse 90,  temperature (!) 97.5 F (36.4 C), temperature source Oral, resp. rate 17, height 6' (1.829 m), weight 78 kg, SpO2 97 %.  Labs: Lab Results  Component Value Date   WBC 8.1 02/12/2021   HGB 14.3 02/12/2021   HCT 45.2 02/12/2021   MCV 81.6 02/12/2021   PLT 149 (L) 02/12/2021    Recent Labs  Lab 02/08/21 0115 02/08/21 0308 02/14/21 0340  NA 138   < > 136  K 5.1   < > 4.0  CL 106   < > 99  CO2 18*   < > 28  BUN 23   < > 20  CREATININE 1.31*   < > 0.86  CALCIUM 9.1   < > 8.8*  PROT 7.4  --   --   BILITOT 1.0  --   --   ALKPHOS 76  --   --   ALT 51*  --   --   AST 47*  --   --   GLUCOSE 211*   < > 123*   < > = values in this interval not displayed.   Lab Results  Component Value Date   TRIG 63 02/09/2021   BNP (last 3 results) Recent Labs    02/08/21 0115  BNP 501.9*    ProBNP (last 3 results) No results for input(s): PROBNP in the last 8760 hours.   Diagnostic Studies/Procedures   CARDIAC CATHETERIZATION  Result Date: 02/12/2021 Findings: On milrinone 0.125 mcg/kg/min RA = 2 RV = 30/2 PA = 31/8 (17) PCW = 5 Fick cardiac output/index = 5.3/2.6 PVR = 2.3 WU Ao sat = 97% PA sat = 70%, 74% Assessment: 1. Normal coronary arteries 2. Severe NICM EF ~20% likely due to HTN 3. Well-compensated hemodynamics on milrinone 0.125 Plan/Discussion: Wean milrinone. Titrate GDMT. Check cMRI. Arvilla Meres, MD 10:07 AM   MR CARDIAC MORPHOLOGY W WO CONTRAST  Result Date: 02/13/2021 CLINICAL DATA:  Cardiomyopathy evaluation EXAM: CARDIAC MRI TECHNIQUE: The patient was scanned on a 1.5 Tesla Siemens magnet. A dedicated cardiac coil was used. Functional imaging was done using Fiesta sequences. 2,3, and 4 chamber views were done to assess for RWMA's. Modified Simpson's rule using a short axis stack was used to calculate an ejection fraction on a dedicated work [employer]. The patient received 10 cc of Gadavist. After 10 minutes inversion recovery sequences were used to  assess for infiltration and scar tissue. CONTRAST:  10 cc  of Gadavist FINDINGS: Left ventricle: -Unable to quantify volumes due to respiratory motion artifact but visually appears severe systolic dysfunction -Elevated native T1 (up to in mid anterolateral wall), T2 (up to 68ms in mid inferior wall), and ECV (up to 30%) in mid inferoseptal wall -Basal septal midwall LGE Right ventricle: Visually with moderate RV systolic dysfunction Left atrium: Mild enlargement Right atrium: Mild enlargement Mitral valve: Not well visualized Aortic valve: Not well visualized Tricuspid valve: Not well visualized Pulmonic valve: Not well visualized Aorta: Normal proximal ascending aorta Pericardium: Trivial effusion IMPRESSION: 1. Technically difficult study as patient had hiccups throughout study and was unable to do breath holds 2. Unable to quantify volumes due to respiratory motion artifact but visually appeared severe LV systolic dysfunction and moderate RV systolic dysfunction 3. Regional elevation in native T1, T2, and ECV consistent with  myocardial edema. Basal septal midwall LGE, which is a scar pattern seen in nonischemic cardiomyopathies. These findings suggest acute myocarditis. Electronically Signed   By: Epifanio Lesches MD   On: 02/13/2021 23:22    Discharge Medications   Allergies as of 02/14/2021       Reactions   Penicillins Nausea And Vomiting, Other (See Comments)   vomiting        Medication List     STOP taking these medications    aspirin EC 81 MG tablet   lisinopril 10 MG tablet Commonly known as: ZESTRIL   metoprolol succinate 25 MG 24 hr tablet Commonly known as: Toprol XL       TAKE these medications    atorvastatin 20 MG tablet Commonly known as: Lipitor Take 1 tablet (20 mg total) by mouth daily.   carvedilol 3.125 MG tablet Commonly known as: COREG Take 1 tablet (3.125 mg total) by mouth 2 (two) times daily with a meal.   digoxin 0.125 MG  tablet Commonly known as: LANOXIN Take 1 tablet (0.125 mg total) by mouth daily.   gabapentin 300 MG capsule Commonly known as: NEURONTIN Take 1 capsule (300 mg total) by mouth 2 (two) times daily.   glyBURIDE 5 MG tablet Commonly known as: DIABETA Take 1 tablet (5 mg total) by mouth 2 (two) times daily with a meal.   isosorbide-hydrALAZINE 20-37.5 MG tablet Commonly known as: BIDIL Take 0.5 tablets by mouth 3 (three) times daily.   linagliptin 5 MG Tabs tablet Commonly known as: TRADJENTA Take 1 tablet (5 mg total) by mouth daily.   sacubitril-valsartan 97-103 MG Commonly known as: ENTRESTO Take 1 tablet by mouth 2 (two) times daily.   spironolactone 25 MG tablet Commonly known as: ALDACTONE Take 1 tablet (25 mg total) by mouth daily.        Disposition   The patient will be discharged in stable condition to home. Discharge Instructions     (HEART FAILURE PATIENTS) Call MD:  Anytime you have any of the following symptoms: 1) 3 pound weight gain in 24 hours or 5 pounds in 1 week 2) shortness of breath, with or without a dry hacking cough 3) swelling in the hands, feet or stomach 4) if you have to sleep on extra pillows at night in order to breathe.   Complete by: As directed    Diet - low sodium heart healthy   Complete by: As directed    Heart Failure patients record your daily weight using the same scale at the same time of day   Complete by: As directed    Increase activity slowly   Complete by: As directed        Follow-up Information     [facility] Follow up on 02/21/2021.   Specialty: Cardiology Why: at 0900 Gar[address] Code 1212. Bring all medications to your appointment. Located on the 1st floor of [facility]. Entrance [address].  Contact information: [address] [id number] Wilhemina Bonito [place] [address] [phone]                  Duration of Discharge Encounter: Greater than 35 minutes    Signed, Tonye Becket NP-C  02/14/2021, 9:11 AM  Patient seen and examined with the above-signed Advanced Practice Provider and/or Housestaff. I personally reviewed laboratory data, imaging studies and relevant notes. I independently examined the patient and formulated the important aspects of the plan. I have edited the note to reflect  any of my changes or salient points. I have personally discussed the plan with the patient and/or family.  Much improved. Ok for d/c with close f/u in HF Clinic. Meds reviewed personally.  Arvilla Meres, MD  8:00 PM

## 2021-02-14 LAB — BASIC METABOLIC PANEL
Anion gap: 9 (ref 5–15)
BUN: 20 mg/dL (ref 8–23)
CO2: 28 mmol/L (ref 22–32)
Calcium: 8.8 mg/dL — ABNORMAL LOW (ref 8.9–10.3)
Chloride: 99 mmol/L (ref 98–111)
Creatinine, Ser: 0.86 mg/dL (ref 0.61–1.24)
GFR, Estimated: >60 mL/min (ref >60)
Glucose, Bld: 123 mg/dL — ABNORMAL HIGH (ref 70–99)
Potassium: 4 mmol/L (ref 3.5–5.1)
Sodium: 136 mmol/L (ref 135–145)

## 2021-02-14 LAB — CULTURE, BLOOD (ROUTINE X 2)
Culture: NO GROWTH
Culture: NO GROWTH
Special Requests: ADEQUATE
Special Requests: ADEQUATE

## 2021-02-14 LAB — GLUCOSE, CAPILLARY
Glucose-Capillary: 145 mg/dL — ABNORMAL HIGH (ref 70–99)
Glucose-Capillary: 151 mg/dL — ABNORMAL HIGH (ref 70–99)
Glucose-Capillary: 222 mg/dL — ABNORMAL HIGH (ref 70–99)
Glucose-Capillary: 282 mg/dL — ABNORMAL HIGH (ref 70–99)

## 2021-02-14 LAB — COOXEMETRY PANEL
Carboxyhemoglobin: 0.9 % (ref 0.5–1.5)
Methemoglobin: 0.6 % (ref 0.0–1.5)
O2 Saturation: 61.2 %
Total hemoglobin: 14.4 g/dL (ref 12.0–16.0)

## 2021-02-14 LAB — DIGOXIN LEVEL: Digoxin Level: 0.2 ng/mL — ABNORMAL LOW (ref 0.8–2.0)

## 2021-02-14 MED ORDER — GABAPENTIN 300 MG PO CAPS: 300.0000 mg | ORAL_CAPSULE | Freq: Two times a day (BID) | ORAL | 1 refills | Status: DC

## 2021-02-14 MED ORDER — SACUBITRIL-VALSARTAN 97-103 MG PO TABS: 1.0000 | ORAL_TABLET | Freq: Two times a day (BID) | ORAL | 6 refills | Status: DC

## 2021-02-14 MED ORDER — SPIRONOLACTONE 25 MG PO TABS: 25.0000 mg | ORAL_TABLET | Freq: Every day | ORAL | 6 refills | Status: DC

## 2021-02-14 MED ORDER — ISOSORB DINITRATE-HYDRALAZINE 20-37.5 MG PO TABS: 0.5000 | ORAL_TABLET | Freq: Three times a day (TID) | ORAL | 6 refills | Status: DC

## 2021-02-14 MED ORDER — CARVEDILOL 3.125 MG PO TABS: 3.1250 mg | ORAL_TABLET | Freq: Two times a day (BID) | ORAL | 6 refills | Status: DC

## 2021-02-14 MED ORDER — DIGOXIN 125 MCG PO TABS: 0.1250 mg | ORAL_TABLET | Freq: Every day | ORAL | 6 refills | Status: DC

## 2021-02-14 MED FILL — SPIRONOLACTONE 25 MG TABLET: 25 | 30 days supply | Qty: 30 | Fill #0

## 2021-02-14 MED FILL — CARVEDILOL 3.125 MG TABLET: 3.125 | 30 days supply | Qty: 60 | Fill #0

## 2021-02-14 MED FILL — DIGOXIN 0.125 MG TABLET: 125 | 30 days supply | Qty: 30 | Fill #0

## 2021-02-14 MED FILL — ENTRESTO 97 MG-103 MG TAB: 97-103 | 30 days supply | Qty: 60 | Fill #0

## 2021-02-14 MED FILL — BIDIL 20-37.5 MG TABS: 20-37.5 | 30 days supply | Qty: 45 | Fill #0

## 2021-02-14 MED FILL — GABAPENTIN 300 MG CAPSULE: 300 | 30 days supply | Qty: 60 | Fill #0

## 2021-02-14 NOTE — Progress Notes (Signed)
1015-1100 Pt wants to wait to wash up before walking. Will return if time permits, staff can walk if I cannot return. CHF education completed with pt who voiced understanding. Gave low sodium diets and discussed 2000 mg sodium restriction. Pt does not have scales at home but stated he can get them. Encouraged him to weigh daily and when to call MD. Gave CHF booklet and reviewed zones. Reviewed signs of CHF. Gave walking instructions and encouraged to begin slowly. Discussed CRP 2 and pt interested in referral to [place]. Luetta Nutting RN BSN 02/14/2021 10:59 AM

## 2021-02-14 NOTE — Care Management Important Message (Signed)
Important Message  Patient Details  Name: Devin Goodman MRN: [id number] Date of Birth: 06-Mar-1956   Medicare Important Message Given:  Yes     Lisbeth Puller Stefan Church 02/14/2021, 3:42 PM

## 2021-02-14 NOTE — TOC Transition Note (Signed)
Transition of Care [facility]) - CM/SW Discharge Note   Patient Details  Name: Devin Goodman MRN: [id number] Date of Birth: 12-06-1956  Transition [facility]) CM/SW Contact:  Leone Haven, RN Phone Number: 02/14/2021, 9:26 AM   Clinical Narrative:    NCM spoke with patient ,informed him of co pay for entresto refills ,  TOC to fill first 30 days and will bring meds to him prior to dc.     Final next level of care: Home/Self Care Barriers to Discharge: No Barriers Identified   Patient Goals and CMS Choice Patient states their goals for this hospitalization and ongoing recovery are:: get better   Choice offered to / list presented to : NA  Discharge Placement                       Discharge Plan and Services                  DME Agency: NA                  Social Determinants of Health (SDOH) Interventions     Readmission Risk Interventions No flowsheet data found.

## 2021-02-14 NOTE — Progress Notes (Addendum)
Advanced Heart Failure Rounding Note   Subjective:    R/LHC showed normal cors and well compensated hemodynamics on milrinone 0.125.   Yesterday bidil 1/2 tab added tid.    Denies SOB. Intermittent hiccups.    Objective:   Weight Range:  Vital Signs:   Temp:  [97.5 F (36.4 C)-98.3 F (36.8 C)] 97.5 F (36.4 C) (02/16 0752) Pulse Rate:  [87-117] 90 (02/16 0752) Resp:  [14-20] 17 (02/16 0752) BP: (90-129)/(68-109) 104/70 (02/16 0752) SpO2:  [95 %-98 %] 97 % (02/16 0800) Weight:  [78 kg] 78 kg (02/16 0400) Last BM Date: 02/13/21  Weight change: Filed Weights   02/12/21 0358 02/13/21 0500 02/14/21 0400  Weight: 79.7 kg 78.7 kg 78 kg    Intake/Output:   Intake/Output Summary (Last 24 hours) at 02/14/2021 0827 Last data filed at 02/14/2021 0000 Gross per 24 hour  Intake --  Output 900 ml  Net -900 ml     PHYSICAL EXAM: General:  Well appearing. No resp difficulty HEENT: normal Neck: supple. no JVD. Carotids 2+ bilat; no bruits. No lymphadenopathy or thryomegaly appreciated. Cor: PMI nondisplaced. Regular rate & rhythm. No rubs, gallops or murmurs. Lungs: clear Abdomen: soft, nontender, nondistended. No hepatosplenomegaly. No bruits or masses. Good bowel sounds. Extremities: no cyanosis, clubbing, rash, edema. RUE PICC Neuro: alert & orientedx3, cranial nerves grossly intact. moves all 4 extremities w/o difficulty. Affect pleasant   Telemetry: NSR 80s    Labs: Basic Metabolic Panel: Recent Labs  Lab 02/08/21 0308 02/08/21 0318 02/10/21 0327 02/11/21 0352 02/12/21 0435 02/12/21 0554 02/12/21 0946 02/12/21 0950 02/12/21 0951 02/13/21 0436 02/14/21 0340  NA 138   < > 138 139 135  --  142 143 140 135 136  K 3.8   < > 3.2* 3.0* 6.8*   < > 3.6 3.3* 4.0 4.1 4.0  CL 101   < > 102 97* 102  --   --   --   --  99 99  CO2 23   < > 27 30 26   --   --   --   --  27 28  GLUCOSE 279*   < > 164* 127* 157*  --   --   --   --  143* 123*  BUN 23   < > 22 14 15    --   --   --   --  14 20  CREATININE 1.29*   < > 1.08 1.06 0.78  --   --   --   --  0.78 0.86  CALCIUM 9.2   < > 8.6* 8.7* 8.4*  --   --   --   --  9.0 8.8*  MG 2.2  --   --   --   --   --   --   --   --   --   --   PHOS 5.6*  --   --   --   --   --   --   --   --   --   --    < > = values in this interval not displayed.    Liver Function Tests: Recent Labs  Lab 02/08/21 0115  AST 47*  ALT 51*  ALKPHOS 76  BILITOT 1.0  PROT 7.4  ALBUMIN 4.0   No results for input(s): LIPASE, AMYLASE in the last 168 hours. No results for input(s): AMMONIA in the last 168 hours.  CBC: Recent Labs  Lab 02/08/21 0115  02/08/21 0318 02/08/21 0828 02/09/21 0551 02/12/21 0946 02/12/21 0950 02/12/21 0951 02/12/21 1038  WBC 7.5  --  14.9* 8.3  --   --   --  8.1  HGB 17.4*   < > 16.0 14.8 13.6 13.3 14.6 14.3  HCT 56.6*   < > 50.1 43.9 40.0 39.0 43.0 45.2  MCV 83.7  --  82.3 80.1  --   --   --  81.6  PLT 319  --  280 187  --   --   --  149*   < > = values in this interval not displayed.    Cardiac Enzymes: No results for input(s): CKTOTAL, CKMB, CKMBINDEX, TROPONINI in the last 168 hours.  BNP: BNP (last 3 results) Recent Labs    02/08/21 0115  BNP 501.9*    ProBNP (last 3 results) No results for input(s): PROBNP in the last 8760 hours.    Other results:  Imaging: CARDIAC CATHETERIZATION  Result Date: 02/12/2021 Findings: On milrinone 0.125 mcg/kg/min RA = 2 RV = 30/2 PA = 31/8 (17) PCW = 5 Fick cardiac output/index = 5.3/2.6 PVR = 2.3 WU Ao sat = 97% PA sat = 70%, 74% Assessment: 1. Normal coronary arteries 2. Severe NICM EF ~20% likely due to HTN 3. Well-compensated hemodynamics on milrinone 0.125 Plan/Discussion: Wean milrinone. Titrate GDMT. Check cMRI. Arvilla Meres, MD 10:07 AM   MR CARDIAC MORPHOLOGY W WO CONTRAST  Result Date: 02/13/2021 CLINICAL DATA:  Cardiomyopathy evaluation EXAM: CARDIAC MRI TECHNIQUE: The patient was scanned on a 1.5 Tesla Siemens magnet. A  dedicated cardiac coil was used. Functional imaging was done using Fiesta sequences. 2,3, and 4 chamber views were done to assess for RWMA's. Modified Simpson's rule using a short axis stack was used to calculate an ejection fraction on a dedicated work [employer], [employer]. The patient received 10 cc of Gadavist. After 10 minutes inversion recovery sequences were used to assess for infiltration and scar tissue. CONTRAST:  10 cc  of Gadavist FINDINGS: Left ventricle: -Unable to quantify volumes due to respiratory motion artifact but visually appears severe systolic dysfunction -Elevated native T1 (up to in mid anterolateral wall), T2 (up to 9ms in mid inferior wall), and ECV (up to 30%) in mid inferoseptal wall -Basal septal midwall LGE Right ventricle: Visually with moderate RV systolic dysfunction Left atrium: Mild enlargement Right atrium: Mild enlargement Mitral valve: Not well visualized Aortic valve: Not well visualized Tricuspid valve: Not well visualized Pulmonic valve: Not well visualized Aorta: Normal proximal ascending aorta Pericardium: Trivial effusion IMPRESSION: 1. Technically difficult study as patient had hiccups throughout study and was unable to do breath holds 2. Unable to quantify volumes due to respiratory motion artifact but visually appeared severe LV systolic dysfunction and moderate RV systolic dysfunction 3. Regional elevation in native T1, T2, and ECV consistent with myocardial edema. Basal septal midwall LGE, which is a scar pattern seen in nonischemic cardiomyopathies. These findings suggest acute myocarditis. Electronically Signed   By: Epifanio Lesches MD   On: 02/13/2021 23:22     Medications:     Scheduled Medications: . carvedilol  3.125 mg Oral BID WC  . Chlorhexidine Gluconate Cloth  6 each Topical Daily  . digoxin  0.125 mg Oral Daily  . docusate sodium  100 mg Oral BID  . enoxaparin (LOVENOX) injection  40 mg Subcutaneous Q24H  . gabapentin   300 mg Oral BID  . insulin aspart  0-15 Units Subcutaneous Q4H  . isosorbide-hydrALAZINE  0.5 tablet Oral TID  . mouth rinse  15 mL Mouth Rinse BID  . sacubitril-valsartan  1 tablet Oral BID  . sodium chloride flush  10-40 mL Intracatheter Q12H  . sodium chloride flush  3 mL Intravenous Q12H  . spironolactone  25 mg Oral Daily    Infusions: . sodium chloride    . sodium chloride      PRN Medications: sodium chloride, acetaminophen, alum & mag hydroxide-simeth, chlorproMAZINE (THORAZINE) injection, docusate sodium, labetalol, ondansetron (ZOFRAN) IV, polyethylene glycol, sodium chloride flush, sodium chloride flush   Assessment/Plan:   1. Acute Hypoxic Respiratory Failure due to pulmonary edema/cardiogenic shock - Intubated 2/10 --> extubated 4 liters [place] - Lactic acid 4.1>3>2.2 - Resolved. On room air.   2. Acute Systolic Heart Failure -> cardiogenic shock -Echo EF < 20% RV moderate HK . No previous history of coronary disease.   -HS Trop 79>39>030 -BNP 501  - R/LHC this admit w/ normal cors, Severe NICM EF ~20% likely due to HTN, well-compensated hemodynamics on milrinone 0.125. CI 2.6 - CO-OX stable off milrinone.  -CMRI - NICM per Dr Gala Romney.  - Volume status stable.  - Continue digoxin 0.125  - Continue spiro 25 mg  - Continue Entresto 97-103 bid  30$ Co Pay  - Continue Coreg 3.125 mg bid  - Continue Bidil 1/2 tablet tid - 30$ Co Pay  - eventual SGLT2i. Will add as an outpatient.   3. Hypertensive Urgency  - Recently stopped ACEi due to cough - BP improved today  - continue meds per above - Improved.    4. DMII - On ssi  - hgb A1c 6.0  - would benefit from SGLT2i w/ concomitant systolic heart failure   5. AKI  - Cardiorenal/ATN - Creatinine on admit 1.3. Baseline < 1.  - resolved  6. Hypokalemia  - K 4.0  today  - supp - continue spiro   Home today with close follow up in the HF clinic. Plan to repeat ECHO 3-4 months.    Length of Stay:  6   Amy Clegg NP-C  02/14/2021, 8:27 AM    Advanced Heart Failure Team Pager [phone] (M-F; 7a - 4p)  Please contact [facility] for night-coverage after hours (4p -7a ) and weekends on [email or web address]  Patient seen and examined with the above-signed Advanced Practice Provider and/or Housestaff. I personally reviewed laboratory data, imaging studies and relevant notes. I independently examined the patient and formulated the important aspects of the plan. I have edited the note to reflect any of my changes or salient points. I have personally discussed the plan with the patient and/or family.  Feels much better but still with hiccups. cMRI reviewed with him   BP improved. Volume status ok.   General:  Well appearing. No resp difficulty + hiccups HEENT: normal Neck: supple. no JVD. Carotids 2+ bilat; no bruits. No lymphadenopathy or thryomegaly appreciated. Cor: PMI nondisplaced. Regular rate & rhythm. No rubs, gallops or murmurs. Lungs: clear Abdomen: soft, nontender, nondistended. No hepatosplenomegaly. No bruits or masses. Good bowel sounds. Extremities: no cyanosis, clubbing, rash, edema Neuro: alert & orientedx3, cranial nerves grossly intact. moves all 4 extremities w/o difficulty. Affect pleasant  Much better today. Can go home today with close f/u in HF Clinic.   Arvilla Meres, MD  9:08 AM

## 2021-02-15 ENCOUNTER — Telehealth (HOSPITAL_COMMUNITY): Payer: Self-pay

## 2021-02-15 NOTE — Telephone Encounter (Signed)
Pt insurance is active and benefits verified through Medicare a/b Co-pay 0, DED $233/$124.76 met, out of pocket 0/0 met, co-insurance 20%. no pre-authorization required. Passport, 02/15/2021'@12' :15pm, REF# [id number]  2ndary insurance is active and benefits verified through [employer]. Co-pay $25, DED 0/0 met, out of pocket $5,650/$25 met, co-insurance 0%. No pre-authorization required. Passport, Anthony/BCBS 02/15/2021'@12' :15pm, REF# [id number]   Will contact patient to see if he is interested in the Cardiac Rehab Program. If interested, patient will need to complete follow up appt. Once completed, patient will be contacted for scheduling upon review by the RN Navigator.

## 2021-02-15 NOTE — Telephone Encounter (Signed)
Attempted to call patient in regards to Cardiac Rehab - LM on VM 

## 2021-02-16 ENCOUNTER — Telehealth: Payer: Self-pay

## 2021-02-16 NOTE — Telephone Encounter (Cosign Needed)
Transition Care Management Unsuccessful Follow-up Telephone Call  Date of discharge and from where:  [place] [facility] 02/14/21  Attempts:  1st Attempt  Reason for unsuccessful TCM follow-up call:  Left voice message

## 2021-02-16 NOTE — Telephone Encounter (Cosign Needed)
Transition Care Management Unsuccessful Follow-up Telephone Call  Date of discharge and from where:  [place] [facility] 02/14/21  Attempts:  3rd Attempt  Reason for unsuccessful TCM follow-up call:  Left voice message with  No return call

## 2021-02-19 ENCOUNTER — Telehealth: Payer: Self-pay

## 2021-02-19 NOTE — Telephone Encounter (Signed)
Caller being treated for hiccup along with heartburn in the emergency room / he also had shortness of breath, they did a cardiac cath -- Caller still has hiccups but states his breathing is fine --- he's on Gabapentin which he states is helping - he just wants to confirm his appt on 02/23 or if he can come in sooner/ He refused Triage

## 2021-02-19 NOTE — Telephone Encounter (Signed)
Spoke with he has a appt with Cardiology ,he stated his breathing is fine,he will contact us after his cardiologist appt to schedule a follow up with Dr.Parker.

## 2021-02-20 NOTE — Progress Notes (Signed)
Advanced Heart Failure Clinic Note  PCP: Dr. Jacquiline Doe HF Cardiologist: Dr. 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 Recently stopped ACEi due to cough.   Presented to [facility] ED on 02/08/21 with increased SOB. He was urgently intubated in the ED. CTA with possible ARDS, CXR concerning for pulmonary edema. Echoshowed severely reduced LVEF <20%, normal RV, and small pericardial effusion.  Ultimately placed on milrinone for cardiogenic shock and diuresed w/ IV Lasix. Extubated 02/09/21.GDMT initiated. R/LHC, on 0.125 of milrinone, showed normal cors and well compensated hemodynamics. CI 2.6. Findings c/w NICM, felt most likely 2/2 HTN. cMRI difficult read with hiccups but felt to be NICM. Discharge weight 171 lbs.  Today he returns for HF follow up. Overall feeling fine, feels breathing is much better. Walks his Yorkie outside without symptoms. Denies increasing SOB, CP, dizziness, edema, or PND/Orthopnea. Appetite ok. No fever or chills. Weight at home ~155-160 pounds. Taking all medications. Gets hypoglycemic when he takes glyburide with his Tradjenta, blood sugars in 50's.  Cardiac Studies: cMRI (2/22): Basal septal midwall LGE, which is a scar pattern seen in nonischemic cardiomyopathies. These findings suggest acute myocarditis.  Echo (2/22): EF < 20% RV moderate HK  R/LHC (2/22): Normal cors Findings: On milrinone 0.125 mcg/kg/min  RA = 2  RV = 30/2 PA = 31/8 (17) PCW = 5 Fick cardiac output/index = 5.3/2.6 PVR = 2.3 WU Ao sat = 97% PA sat = 70%, 74%  ROS: All systems negative except as listed in HPI, PMH and Problem List.  SH:  Social History   Socioeconomic History   Marital status: Married    Spouse name: Not on file   Number of children: Not on file   Years of education: Not on file   Highest education level: Not on file  Occupational History   Not on file  Tobacco Use   Smoking  status: Never Smoker   Smokeless tobacco: Never Used  Vaping Use   Vaping Use: Never used  Substance and Sexual Activity   Alcohol use: Yes    Comment: occ   Drug use: Never   Sexual activity: Not on file  Other Topics Concern   Not on file  Social History Narrative   Not on file   Social Determinants of Health   Financial Resource Strain: Not on file  Food Insecurity: Not on file  Transportation Needs: Not on file  Physical Activity: Not on file  Stress: Not on file  Social Connections: Not on file  Intimate Partner Violence: Not on file   FH:  Family History  Problem Relation Age of Onset   Cancer Mother    Past Medical History:  Diagnosis Date   Diabetes mellitus without complication (HCC)    Hyperlipidemia    Hypertension    Current Outpatient Medications  Medication Sig Dispense Refill   atorvastatin (LIPITOR) 20 MG tablet Take 1 tablet (20 mg total) by mouth daily. 90 tablet 3   carvedilol (COREG) 3.125 MG tablet Take 1 tablet (3.125 mg total) by mouth 2 (two) times daily with a meal. 60 tablet 6   digoxin (LANOXIN) 0.125 MG tablet Take 1 tablet (0.125 mg total) by mouth daily. 30 tablet 6   gabapentin (NEURONTIN) 300 MG capsule Take 1 capsule (300 mg total) by mouth 2 (two) times daily. 60 capsule 1   glyBURIDE (DIABETA) 5 MG tablet Take 1 tablet (5 mg total)  by mouth 2 (two) times daily with a meal. 180 tablet 3   isosorbide-hydrALAZINE (BIDIL) 20-37.5 MG tablet Take 0.5 tablets by mouth 3 (three) times daily. 45 tablet 6   linagliptin (TRADJENTA) 5 MG TABS tablet Take 1 tablet (5 mg total) by mouth daily. 30 tablet 0   sacubitril-valsartan (ENTRESTO) 97-103 MG Take 1 tablet by mouth 2 (two) times daily. 60 tablet 6   spironolactone (ALDACTONE) 25 MG tablet Take 1 tablet (25 mg total) by mouth daily. 30 tablet 6   No current facility-administered medications for this encounter.    Vitals:   02/21/21 0922  Weight: 79.4 kg (175 lb)   Wt  Readings from Last 3 Encounters:  02/21/21 79.4 kg (175 lb)  02/14/21 78 kg (171 lb 15.3 oz)  01/08/21 83.4 kg (183 lb 12.8 oz)   PHYSICAL EXAM: General:  Well appearing. No resp difficulty HEENT: normal Neck: supple. JVP flat. Carotids 2+ bilaterally; no bruits. No lymphadenopathy or thryomegaly appreciated. Cor: PMI normal. Regular rate & rhythm. No rubs, gallops or murmurs. Lungs: clear Abdomen: soft, nontender, nondistended. No hepatosplenomegaly. No bruits or masses. Good bowel sounds. Extremities: no cyanosis, clubbing, rash, edema Neuro: alert & orientedx3, cranial nerves grossly intact. Moves all 4 extremities w/o difficulty. Affect pleasant.  ECG: SR w/ PAC, LVH 88 bpm (personally reviewed).  ASSESSMENT & PLAN:  1. Chronic Systolic Heart Failure - Echo EF < 20% RV moderate HK. No previous history of coronary disease. - R/LHC this admit w/ normal cors, Severe NICM EF ~20% likely due to HTN.  - cMRI (2/22): difficult read with hiccups but felt to be NICM. - NYHA II, Volume status stable.  - Start Farxiga 10 mg daily. - Continue digoxin 0.125 mg daily. - Continue spiro 25 mg daily. - Continue Entresto 97-103 mg bid.  - Continue Coreg 3.125 mg bid.  - Continue Bidil 1/2 tablet tid.  - BMET & digoxin level today; repeat BMET 7-10 days.  2. HTN - Recently stopped ACEi due to cough. - BP improved.  - Continue current regimen.  3. DMII - hgb A1c 6.0. Having low blood sugars when he takes his Tradjenta with his glyburide. - Stop glyburide. - Start Farxiga. - Needs close follow up with PCP, encouraged him to check blood sugars daily.  4. HLD - Continue atorva. - Followed by PCP.  - Follow up in 3 weeks with PharmD for further medication titration and in 6 weeks in APP clinic.  - Plan to repeat echo 3-4 months when GDMT optimized.   Prince Rome, FNP-BC 02/21/21

## 2021-02-21 ENCOUNTER — Other Ambulatory Visit: Payer: Self-pay

## 2021-02-21 ENCOUNTER — Ambulatory Visit (HOSPITAL_COMMUNITY)
Admit: 2021-02-21 | Discharge: 2021-02-21 | Disposition: A | Discharge status: Home / Self Care | Payer: Medicare Other | Source: Ambulatory Visit | Attending: Family Medicine | Admitting: Family Medicine

## 2021-02-21 ENCOUNTER — Encounter (HOSPITAL_COMMUNITY): Payer: Self-pay

## 2021-02-21 VITALS — BP 124/72 | HR 90 | Wt 175.0 lb

## 2021-02-21 DIAGNOSIS — I10 Essential (primary) hypertension: Secondary | ICD-10-CM

## 2021-02-21 DIAGNOSIS — Z7984 Long term (current) use of oral hypoglycemic drugs: Secondary | ICD-10-CM | POA: Insufficient documentation

## 2021-02-21 DIAGNOSIS — E119 Type 2 diabetes mellitus without complications: Secondary | ICD-10-CM | POA: Insufficient documentation

## 2021-02-21 DIAGNOSIS — E785 Hyperlipidemia, unspecified: Secondary | ICD-10-CM | POA: Diagnosis not present

## 2021-02-21 DIAGNOSIS — R9389 Abnormal findings on diagnostic imaging of other specified body structures: Secondary | ICD-10-CM | POA: Diagnosis not present

## 2021-02-21 DIAGNOSIS — R059 Cough, unspecified: Secondary | ICD-10-CM | POA: Diagnosis not present

## 2021-02-21 DIAGNOSIS — I5022 Chronic systolic (congestive) heart failure: Secondary | ICD-10-CM | POA: Diagnosis not present

## 2021-02-21 DIAGNOSIS — R066 Hiccough: Secondary | ICD-10-CM | POA: Insufficient documentation

## 2021-02-21 DIAGNOSIS — I428 Other cardiomyopathies: Secondary | ICD-10-CM | POA: Insufficient documentation

## 2021-02-21 DIAGNOSIS — I110 Hypertensive heart disease with heart failure: Secondary | ICD-10-CM | POA: Insufficient documentation

## 2021-02-21 DIAGNOSIS — Z79899 Other long term (current) drug therapy: Secondary | ICD-10-CM | POA: Diagnosis not present

## 2021-02-21 LAB — BASIC METABOLIC PANEL
Anion gap: 8 (ref 5–15)
BUN: 13 mg/dL (ref 8–23)
CO2: 25 mmol/L (ref 22–32)
Calcium: 9.1 mg/dL (ref 8.9–10.3)
Chloride: 103 mmol/L (ref 98–111)
Creatinine, Ser: 0.88 mg/dL (ref 0.61–1.24)
GFR, Estimated: >60 mL/min (ref >60)
Glucose, Bld: 145 mg/dL — ABNORMAL HIGH (ref 70–99)
Potassium: 4.4 mmol/L (ref 3.5–5.1)
Sodium: 136 mmol/L (ref 135–145)

## 2021-02-21 LAB — DIGOXIN LEVEL: Digoxin Level: 0.3 ng/mL — ABNORMAL LOW (ref 0.8–2.0)

## 2021-02-21 MED ORDER — DAPAGLIFLOZIN PROPANEDIOL 10 MG PO TABS: 10.0000 mg | ORAL_TABLET | Freq: Every day | ORAL | 11 refills | Status: DC

## 2021-02-21 NOTE — Patient Instructions (Signed)
STOP Glyburide START Farxiga 10 mg, one tab daily  Labs today We will only contact you if something comes back abnormal or we need to make some changes. Otherwise no news is good news!  Labs needed in 7-10 days   Your physician recommends that you schedule a follow-up appointment in: 3 weeks with the pharmacy team and 6 weeks  in the Advanced Practitioners (PA/NP) Clinic    At the Advanced Heart Failure Clinic, you and your health needs are our priority. As part of our continuing mission to provide you with exceptional heart care, we have created designated Provider Care Teams. These Care Teams include your primary Cardiologist (physician) and Advanced Practice Providers (APPs- Physician Assistants and Nurse Practitioners) who all work together to provide you with the care you need, when you need it.   You may see any of the following providers on your designated Care Team at your next follow up: Marland Kitchen Dr Arvilla Meres . Dr Marca Ancona . Dr Thornell Mule . Tonye Becket, NP . Robbie Lis, PA . Shanda Bumps [place],NP . Karle Plumber, PharmD   Please be sure to bring in all your medications bottles to every appointment.     Do the following things EVERYDAY: 1) Weigh yourself in the morning before breakfast. Write it down and keep it in a log. 2) Take your medicines as prescribed 3) Eat low salt foods--Limit salt (sodium) to 2000 mg per day.  4) Stay as active as you can everyday 5) Limit all fluids for the day to less than 2 liters  If you have any questions or concerns before your next appointment please send [place] a message through [place] or call our office at [phone].    TO LEAVE A MESSAGE FOR THE NURSE SELECT OPTION 2, PLEASE LEAVE A MESSAGE INCLUDING: . YOUR NAME . DATE OF BIRTH . CALL BACK NUMBER . REASON FOR CALL**this is important as we prioritize the call backs  YOU WILL RECEIVE A CALL BACK THE SAME DAY AS LONG AS YOU CALL BEFORE 4:00 PM

## 2021-02-23 ENCOUNTER — Telehealth (HOSPITAL_COMMUNITY): Payer: Self-pay | Admitting: *Deleted

## 2021-02-23 NOTE — Telephone Encounter (Signed)
Pt left vm requesting return call about lab results. I called pt back no answer/left vm for pt to return call.   Theresia Bough, CMA  02/22/2021 4:27 PM EST Back to Top     [phone] [facility]) LMOM    Anderson Malta [place], [place]  02/21/2021 4:33 PM EST      Labs stable, digoxin level low. Can we make sure he is taking it daily?

## 2021-02-27 ENCOUNTER — Other Ambulatory Visit (HOSPITAL_COMMUNITY)
Admission: AD | Admit: 2021-02-27 | Discharge: 2021-02-27 | Disposition: A | Discharge status: Home / Self Care | Payer: Medicare Other | Source: Ambulatory Visit | Attending: Internal Medicine | Admitting: Internal Medicine

## 2021-02-27 ENCOUNTER — Ambulatory Visit (HOSPITAL_COMMUNITY)
Admission: RE | Admit: 2021-02-27 | Discharge: 2021-02-27 | Disposition: A | Discharge status: Home / Self Care | Payer: Medicare Other | Source: Ambulatory Visit | Attending: Cardiology | Admitting: Cardiology

## 2021-02-27 ENCOUNTER — Other Ambulatory Visit: Payer: Self-pay

## 2021-02-27 DIAGNOSIS — I5022 Chronic systolic (congestive) heart failure: Secondary | ICD-10-CM | POA: Diagnosis present

## 2021-02-27 LAB — BASIC METABOLIC PANEL
Anion gap: 9 (ref 5–15)
BUN: 19 mg/dL (ref 8–23)
CO2: 28 mmol/L (ref 22–32)
Calcium: 9.7 mg/dL (ref 8.9–10.3)
Chloride: 103 mmol/L (ref 98–111)
Creatinine, Ser: 1.11 mg/dL (ref 0.61–1.24)
GFR, Estimated: >60 mL/min (ref >60)
Glucose, Bld: 158 mg/dL — ABNORMAL HIGH (ref 70–99)
Potassium: 4.1 mmol/L (ref 3.5–5.1)
Sodium: 140 mmol/L (ref 135–145)

## 2021-02-28 ENCOUNTER — Other Ambulatory Visit (HOSPITAL_COMMUNITY): Payer: Medicare Other

## 2021-03-02 NOTE — Progress Notes (Incomplete)
***  In Progress*** PCP: Dr. Jacquiline Doe HF Cardiologist: Dr. 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 Recently stopped ACEi due to cough.  Presented to [facility] 2/10/22with increased SOB. He was urgently intubated in the ED. CTA with possible ARDS, CXR concerning for pulmonary edema. Echoshowed severely reduced LVEF <20%, normal RV, and small pericardial effusion.  Ultimately placed on milrinone for cardiogenic shock and diuresed w/ IV furosemide. Extubated 02/09/21.GDMT initiated.R/LHC, on 0.125 of milrinone, showed normal cors and well compensated hemodynamics. CI 2.6. Findings c/w NICM, felt most likely 2/2 HTN. cMRIdifficult read with hiccups but felt to be NICM. Discharge weight 171 lbs.  Recently returned to HF Clinic for HF follow up on 02/21/21. Overall was feeling fine, felt breathing was much better. Stated he could walk his Yorkie outside without symptoms. Denied increasing SOB, CP, dizziness, edema, or PND/Orthopnea. Appetite was  ok. No fever or chills. Weight at home was ~155-160 pounds. Reported taking all medications. Gets hypoglycemic when he takes glyburide with his Tradjenta, blood sugars in 50's.  Today he returns to HF clinic for pharmacist medication titration. At last visit with APP, glyburide was discontinued and Farxiga 10 mg daily was initiated.   . Shortness of breath/dyspnea on exertion? {YES [id number]  . Orthopnea/PND? {YES [id number] . Edema? {YES [id number] . Lightheadedness/dizziness? {YES [id number] . Daily weights at home? {YES [id number] . Blood pressure/heart rate monitoring at home? {YES [id number] . Following low-sodium/fluid-restricted diet? {YES NO:22349}  HF Medications: Carvedilol 3.125 mg BID Entresto 97/103 mg BID Spironolactone 25 mg daily Farxiga 10 mg daily Bidil 20-37.5 mg 0.5 tablets TID Digoxin 0.125 mg daily  Has the patient been experiencing any side effects  to the medications prescribed?  {YES NO:22349}  Does the patient have any problems obtaining medications due to transportation or finances?   {YES NO:22349}  Understanding of regimen: {excellent/good/fair/poor:19665} Understanding of indications: {excellent/good/fair/poor:19665} Potential of compliance: {excellent/good/fair/poor:19665} Patient understands to avoid NSAIDs. Patient understands to avoid decongestants.    Pertinent Lab Values: . Serum creatinine ***, BUN ***, Potassium ***, Sodium ***, BNP ***, Magnesium ***, Digoxin ***   Vital Signs: . Weight: *** (last clinic weight: ***) . Blood pressure: ***  . Heart rate: ***   Assessment/Plan: 1. Chronic Systolic Heart Failure - Echo EF < 20% RV moderate HK. No previous history of coronary disease. - R/LHC 02/12/2021 w/ normal cors, Severe NICM EF ~20% likely due to HTN.  - cMRI (2/22): difficult read with hiccups but felt to be NICM. - NYHA II, Volume status stable. - Continue carvedilol 3.125 mg BID - Continue Entresto 97/103 mg BID - Continue spironolactone 25 mg daily - Continue Farxiga 10 mg daily -ContinueBidil 20-37.5 mg 1/2 tablet TID. - Continue digoxin 0.125 mg daily.  2. HTN - Recently stopped ACEi due to cough but has tolerated Entresto well - BPimproved.  - Continue current regimen.  3. DMII - hgb A1c 6.0. Having low blood sugars when he takes his Tradjenta with his glyburide. *** - Continue Tradjenta and Farxiga - Needs close follow up with PCP, encouraged him to check blood sugars daily.  4. HLD - Continue atorvastatin. - Followed by PCP.   Karle Plumber, PharmD, BCPS, BCCP, CPP Heart Failure Clinic Pharmacist [phone]

## 2021-03-15 ENCOUNTER — Inpatient Hospital Stay (HOSPITAL_COMMUNITY): Admission: RE | Admit: 2021-03-15 | Payer: Medicare Other | Source: Ambulatory Visit

## 2021-03-16 ENCOUNTER — Telehealth (HOSPITAL_COMMUNITY): Payer: Self-pay

## 2021-03-16 NOTE — Telephone Encounter (Signed)
Called and spoke with pt in regards to CR, pt stated he is not interested at this time.   Closed referral 

## 2021-03-20 ENCOUNTER — Inpatient Hospital Stay: Payer: Medicare Other | Admitting: Family Medicine

## 2021-04-04 ENCOUNTER — Telehealth: Payer: Self-pay

## 2021-04-04 ENCOUNTER — Other Ambulatory Visit: Payer: Self-pay | Admitting: *Deleted

## 2021-04-04 MED ORDER — LINAGLIPTIN 5 MG PO TABS: 5.0000 mg | ORAL_TABLET | Freq: Every day | ORAL | 0 refills | Status: DC

## 2021-04-04 NOTE — Telephone Encounter (Signed)
Rx send to pharmacy  

## 2021-04-04 NOTE — Telephone Encounter (Signed)
MEDICATION: linagliptin (TRADJENTA) 5 MG TABS tablet  PHARMACY:  [id number] [place], [place] - [address] AT [address] [facility] [address] Phone:  [phone]  Fax:  [phone]       Comments:  Patient  Is completely out.   **Let patient know to contact pharmacy at the end of the day to make sure medication is ready. **  ** Please notify patient to allow 48-72 hours to process**  **Encourage patient to contact the pharmacy for refills or they can request refills through [facility]**

## 2021-04-05 ENCOUNTER — Ambulatory Visit (HOSPITAL_COMMUNITY)
Admission: RE | Admit: 2021-04-05 | Discharge: 2021-04-05 | Disposition: A | Discharge status: Home / Self Care | Payer: Medicare Other | Source: Ambulatory Visit | Attending: Internal Medicine | Admitting: Internal Medicine

## 2021-04-05 ENCOUNTER — Telehealth (HOSPITAL_COMMUNITY): Payer: Self-pay | Admitting: Cardiology

## 2021-04-05 ENCOUNTER — Other Ambulatory Visit: Payer: Self-pay

## 2021-04-05 ENCOUNTER — Encounter (HOSPITAL_COMMUNITY): Payer: Self-pay

## 2021-04-05 VITALS — BP 140/80 | HR 77 | Wt 172.6 lb

## 2021-04-05 DIAGNOSIS — I10 Essential (primary) hypertension: Secondary | ICD-10-CM

## 2021-04-05 DIAGNOSIS — Z7984 Long term (current) use of oral hypoglycemic drugs: Secondary | ICD-10-CM | POA: Diagnosis not present

## 2021-04-05 DIAGNOSIS — I110 Hypertensive heart disease with heart failure: Secondary | ICD-10-CM | POA: Diagnosis not present

## 2021-04-05 DIAGNOSIS — R059 Cough, unspecified: Secondary | ICD-10-CM | POA: Diagnosis not present

## 2021-04-05 DIAGNOSIS — I509 Heart failure, unspecified: Secondary | ICD-10-CM | POA: Diagnosis not present

## 2021-04-05 DIAGNOSIS — E785 Hyperlipidemia, unspecified: Secondary | ICD-10-CM | POA: Insufficient documentation

## 2021-04-05 DIAGNOSIS — Z79899 Other long term (current) drug therapy: Secondary | ICD-10-CM | POA: Diagnosis not present

## 2021-04-05 DIAGNOSIS — I5022 Chronic systolic (congestive) heart failure: Secondary | ICD-10-CM | POA: Insufficient documentation

## 2021-04-05 DIAGNOSIS — E119 Type 2 diabetes mellitus without complications: Secondary | ICD-10-CM | POA: Diagnosis not present

## 2021-04-05 LAB — BASIC METABOLIC PANEL
Anion gap: 5 (ref 5–15)
BUN: 19 mg/dL (ref 8–23)
CO2: 27 mmol/L (ref 22–32)
Calcium: 9.6 mg/dL (ref 8.9–10.3)
Chloride: 105 mmol/L (ref 98–111)
Creatinine, Ser: 0.95 mg/dL (ref 0.61–1.24)
GFR, Estimated: >60 mL/min (ref >60)
Glucose, Bld: 118 mg/dL — ABNORMAL HIGH (ref 70–99)
Potassium: 4.6 mmol/L (ref 3.5–5.1)
Sodium: 137 mmol/L (ref 135–145)

## 2021-04-05 NOTE — Patient Instructions (Signed)
It was great to see you today! No medication changes are needed at this time.  Labs today We will only contact you if something comes back abnormal or we need to make some changes. Otherwise no news is good news!  Your physician recommends that you schedule a follow-up appointment in: 8 weeks with Dr Gala Romney and echo  Your physician has requested that you have an echocardiogram. Echocardiography is a painless test that uses sound waves to create images of your heart. It provides your doctor with information about the size and shape of your heart and how well your heart's chambers and valves are working. This procedure takes approximately one hour. There are no restrictions for this procedure.  Do the following things EVERYDAY: 1) Weigh yourself in the morning before breakfast. Write it down and keep it in a log. 2) Take your medicines as prescribed 3) Eat low salt foods--Limit salt (sodium) to 2000 mg per day.  4) Stay as active as you can everyday 5) Limit all fluids for the day to less than 2 liters  At the Advanced Heart Failure Clinic, you and your health needs are our priority. As part of our continuing mission to provide you with exceptional heart care, we have created designated Provider Care Teams. These Care Teams include your primary Cardiologist (physician) and Advanced Practice Providers (APPs- Physician Assistants and Nurse Practitioners) who all work together to provide you with the care you need, when you need it.   You may see any of the following providers on your designated Care Team at your next follow up: Marland Kitchen Dr Arvilla Meres . Dr Marca Ancona . Dr Thornell Mule . Tonye Becket, NP . Robbie Lis, PA . Shanda Bumps [place],NP . Karle Plumber, PharmD   Please be sure to bring in all your medications bottles to every appointment.   If you have any questions or concerns before your next appointment please send [place] a message through [place] or call our office at  [phone].    TO LEAVE A MESSAGE FOR THE NURSE SELECT OPTION 2, PLEASE LEAVE A MESSAGE INCLUDING: . YOUR NAME . DATE OF BIRTH . CALL BACK NUMBER . REASON FOR CALL**this is important as we prioritize the call backs  YOU WILL RECEIVE A CALL BACK THE SAME DAY AS LONG AS YOU CALL BEFORE 4:00 PM  Please see our updated No Show and Same Day Appointment Cancellation Policy attached to your AVS.

## 2021-04-05 NOTE — Telephone Encounter (Signed)
appt reminder call placed Detailed message left for patient

## 2021-04-05 NOTE — Progress Notes (Signed)
Advanced Heart Failure Clinic Note  PCP: Dr. Jacquiline Doe HF Cardiologist: Dr. 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 Recently stopped ACEi due to cough.   Presented to [facility] ED on 02/08/21 with increased SOB. He was urgently intubated in the ED. CTA with possible ARDS, CXR concerning for pulmonary edema. Echoshowed severely reduced LVEF <20%, normal RV, and small pericardial effusion.  Ultimately placed on milrinone for cardiogenic shock and diuresed w/ IV Lasix. Extubated 02/09/21.GDMT initiated. R/LHC, on 0.125 of milrinone, showed normal cors and well compensated hemodynamics. CI 2.6. Findings c/w NICM, felt most likely 2/2 HTN. cMRI difficult read with hiccups but felt to be NICM. Discharge weight 171 lbs.  Today he returns for HF follow up.Overall feeling fine. Denies SOB/PND/Orthopnea. Appetite ok. No fever or chills. Walking 5-6 times a day with his dog. Weight at home 163-167. He monitors BP at home and if SBP < 110 he will hold his medications.  Says he feels poorly if SBP < 110. Taking all medications. Lives with his wife.   Cardiac Studies: cMRI (2/22): Basal septal midwall LGE, which is a scar pattern seen in nonischemic cardiomyopathies. These findings suggest acute myocarditis.  Echo (2/22): EF < 20% RV moderate HK  R/LHC (2/22): Normal cors Findings: On milrinone 0.125 mcg/kg/min RA = 2  RV = 30/2 PA = 31/8 (17) PCW = 5 Fick cardiac output/index = 5.3/2.6 PVR = 2.3 WU Ao sat = 97% PA sat = 70%, 74%  ROS: All systems negative except as listed in HPI, PMH and Problem List.  SH:  Social History   Socioeconomic History  . Marital status: Married    Spouse name: Not on file  . Number of children: Not on file  . Years of education: Not on file  . Highest education level: Not on file  Occupational History  . Not on file  Tobacco Use  . Smoking status: Never Smoker  . Smokeless  tobacco: Never Used  Vaping Use  . Vaping Use: Never used  Substance and Sexual Activity  . Alcohol use: Yes    Comment: occ  . Drug use: Never  . Sexual activity: Not on file  Other Topics Concern  . Not on file  Social History Narrative  . Not on file   Social Determinants of Health   Financial Resource Strain: Not on file  Food Insecurity: Not on file  Transportation Needs: Not on file  Physical Activity: Not on file  Stress: Not on file  Social Connections: Not on file  Intimate Partner Violence: Not on file   FH:  Family History  Problem Relation Age of Onset  . Cancer Mother    Past Medical History:  Diagnosis Date  . Diabetes mellitus without complication (HCC)   . Hyperlipidemia   . Hypertension    Current Outpatient Medications  Medication Sig Dispense Refill  . atorvastatin (LIPITOR) 20 MG tablet Take 1 tablet (20 mg total) by mouth daily. 90 tablet 3  . carvedilol (COREG) 3.125 MG tablet Take 1 tablet (3.125 mg total) by mouth 2 (two) times daily with a meal. 60 tablet 6  . dapagliflozin propanediol (FARXIGA) 10 MG TABS tablet Take 1 tablet (10 mg total) by mouth daily before breakfast. 30 tablet 11  . digoxin (LANOXIN) 0.125 MG tablet Take 1 tablet (0.125 mg total) by mouth daily. 30 tablet 6  . isosorbide-hydrALAZINE (BIDIL) 20-37.5 MG tablet Take  0.5 tablets by mouth 3 (three) times daily. 45 tablet 6  . linagliptin (TRADJENTA) 5 MG TABS tablet Take 1 tablet (5 mg total) by mouth daily. 30 tablet 0  . sacubitril-valsartan (ENTRESTO) 97-103 MG Take 1 tablet by mouth 2 (two) times daily. 60 tablet 6  . spironolactone (ALDACTONE) 25 MG tablet Take 1 tablet (25 mg total) by mouth daily. 30 tablet 6   No current facility-administered medications for this encounter.    Vitals:   04/05/21 1022  BP: 140/80  Pulse: 77  SpO2: 99%  Weight: 78.3 kg (172 lb 9.6 oz)   Wt Readings from Last 3 Encounters:  04/05/21 78.3 kg (172 lb 9.6 oz)  02/21/21 79.4 kg (175  lb)  02/14/21 78 kg (171 lb 15.3 oz)   PHYSICAL EXAM: General:  Well appearing. No resp difficulty HEENT: normal Neck: supple. no JVD. Carotids 2+ bilat; no bruits. No lymphadenopathy or thryomegaly appreciated. Cor: PMI nondisplaced. Regular rate & rhythm. No rubs, gallops or murmurs. Lungs: clear Abdomen: soft, nontender, nondistended. No hepatosplenomegaly. No bruits or masses. Good bowel sounds. Extremities: no cyanosis, clubbing, rash, edema Neuro: alert & orientedx3, cranial nerves grossly intact. moves all 4 extremities w/o difficulty. Affect pleasant  ASSESSMENT & PLAN:  1. Chronic Systolic Heart Failure - Echo EF < 20% RV moderate HK. No previous history of coronary disease. -02/08/21  R/LHC this admit w/ normal cors, Severe NICM EF ~20% likely due to HTN.  - cMRI (2/22): difficult read with hiccups but felt to be NICM. - NYHA I.  Volume status stable. He has not taken medications this morning.  - Continue  Farxiga 10 mg daily. - Continue digoxin 0.125 mg daily. - Continue spiro 25 mg daily. - Continue Entresto 97-103 mg bid.  - Continue Coreg 3.125 mg bid.  - Continue Bidil 1/2 tablet tid.  - Check BMET today.  - Repeat ECHO in 6-8 weeks with Dr Gala Romney.   2. HTN -Stable.   3. DMII - hgb A1c 6.0. Having low blood sugars when he takes his Tradjenta with his glyburide. - Continue  Farxiga.  4. HLD - Continue atorva. - Followed by PCP.  Check BMET and follow with Dr Gala Romney in 8 weeks an an ECHO. Discussed purpose of ECHO and medications.    Tryson Lumley 10:47 AM

## 2021-04-06 ENCOUNTER — Other Ambulatory Visit: Payer: Self-pay

## 2021-04-06 ENCOUNTER — Ambulatory Visit (INDEPENDENT_AMBULATORY_CARE_PROVIDER_SITE_OTHER): Payer: Medicare Other | Admitting: Family Medicine

## 2021-04-06 ENCOUNTER — Encounter: Payer: Self-pay | Admitting: Family Medicine

## 2021-04-06 VITALS — BP 90/55 | HR 92 | Temp 97.2°F | Ht 72.0 in | Wt 175.0 lb

## 2021-04-06 DIAGNOSIS — I5020 Unspecified systolic (congestive) heart failure: Secondary | ICD-10-CM | POA: Diagnosis not present

## 2021-04-06 DIAGNOSIS — Z79899 Other long term (current) drug therapy: Secondary | ICD-10-CM | POA: Diagnosis not present

## 2021-04-06 DIAGNOSIS — G588 Other specified mononeuropathies: Secondary | ICD-10-CM | POA: Diagnosis not present

## 2021-04-06 DIAGNOSIS — E1165 Type 2 diabetes mellitus with hyperglycemia: Secondary | ICD-10-CM

## 2021-04-06 DIAGNOSIS — E785 Hyperlipidemia, unspecified: Secondary | ICD-10-CM

## 2021-04-06 DIAGNOSIS — I10 Essential (primary) hypertension: Secondary | ICD-10-CM

## 2021-04-06 DIAGNOSIS — G629 Polyneuropathy, unspecified: Secondary | ICD-10-CM | POA: Insufficient documentation

## 2021-04-06 LAB — VITAMIN B12: Vitamin B-12: 326 pg/mL (ref 211–911)

## 2021-04-06 LAB — TSH: TSH: 1.13 u[IU]/mL (ref 0.35–4.50)

## 2021-04-06 LAB — HEMOGLOBIN A1C: Hgb A1c MFr Bld: 6.6 % — ABNORMAL HIGH (ref 4.6–6.5)

## 2021-04-06 MED ORDER — LINAGLIPTIN 5 MG PO TABS: 5.0000 mg | ORAL_TABLET | Freq: Every day | ORAL | 5 refills | Status: DC

## 2021-04-06 NOTE — Assessment & Plan Note (Signed)
We will check additional labs today.  Possibly diabetic related though his A1c's have been well controlled.  Check B12 and A1c.  Also check TSH.

## 2021-04-06 NOTE — Assessment & Plan Note (Signed)
Managed by cardiology.  He is managing well with current medications.  No signs of volume overload today.  He will have repeat echo again in a couple of months.

## 2021-04-06 NOTE — Assessment & Plan Note (Signed)
Continue atorvastatin 20 mg daily. 

## 2021-04-06 NOTE — Progress Notes (Signed)
Please inform patient of the following:  His labs are all stable. A1c is up a small amount but is ok. 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 I would like to see him back in 3 months to recheck his A1c. Would like for him to let us know if his neuropathy is persisting and we can refer him to neurology if he wishes.  [name]. [name], MD 04/06/2021 3:31 PM

## 2021-04-06 NOTE — Assessment & Plan Note (Signed)
Recently switched from glyburide to [place].  He is on Tradjenta 5 mg daily as well.  We will recheck A1c today.

## 2021-04-06 NOTE — Assessment & Plan Note (Signed)
Borderline low today.  No current symptoms.  We will continue current regimen per cardiology.

## 2021-04-06 NOTE — Progress Notes (Signed)
   Connie Hilgert is a 65 y.o. male who presents today for an office visit.  Assessment/Plan:  Chronic Problems Addressed Today: Peripheral neuropathy We will check additional labs today.  Possibly diabetic related though his A1c's have been well controlled.  Check B12 and A1c.  Also check TSH.  HFrEF (heart failure with reduced ejection fraction) (HCC) Managed by cardiology.  He is managing well with current medications.  No signs of volume overload today.  He will have repeat echo again in a couple of months.  Essential hypertension Borderline low today.  No current symptoms.  We will continue current regimen per cardiology.  Dyslipidemia Continue atorvastatin 20 mg daily.  Type 2 diabetes mellitus with hyperglycemia (HCC) Recently switched from glyburide to [place].  He is on Tradjenta 5 mg daily as well.  We will recheck A1c today.     Subjective:  HPI:  Patient is here for follow-up.  He was last seen 3 months ago.  Since our last visit he is unfortunately admitted to the hospital with CHF exacerbation.  He presented to the ED on 2/10.  He was urgently intubated in emergency room and started on nitroglycerin drip.  Unfortunately he ultimately developed cardiogenic shock and was started on milrinone.  He was diuresed with IV Lasix.  He was able to be extubated on 2/11.  He was able to be weaned off milrinone.  Developed hiccups and was started on gabapentin.  He followed up with cardiology after discharge.  He was doing well at that time.  He has been doing well the last few weeks.  No further episodes of chest pain or progress.  He is concerned about his blood pressure readings being low.  He has also noticed a "odd sensation" in bilateral feet.  This seems to be worsening the last couple of weeks.  No pain.  Initially located on bottom of his feet.  He is worried about possible circulation issues.  He has been tolerating his medication changes well.       Objective:   Physical Exam: BP (!) 90/55   Pulse 92   Temp (!) 97.2 F (36.2 C) (Temporal)   Ht 6' (1.829 m)   Wt 175 lb (79.4 kg)   SpO2 100%   BMI 23.73 kg/m   Gen: No acute distress, resting comfortably CV: Regular rate and rhythm with no murmurs appreciated Pulm: Normal work of breathing, clear to auscultation bilaterally with no crackles, wheezes, or rhonchi MSK: Bilateral feet without abnormality.  DP and PT pulses intact.  Sensation to light touch intact. Neuro: Grossly normal, moves all extremities Psych: Normal affect and thought content  Time Spent: 47 minutes of total time was spent on the date of the encounter performing the following actions: chart review prior to seeing the patient, obtaining history, performing a medically necessary exam, counseling on the treatment plan, placing orders, and documenting in our EHR.        [name]. [name], MD 04/06/2021 10:55 AM

## 2021-04-06 NOTE — Patient Instructions (Signed)
It was very nice to see you today!  We will check blood work today.  I would like to see you back in 3 months.  Please come back to see me sooner if needed.  Take care, Dr [name]  PLEASE NOTE:  If you had any lab tests please let us know if you have not heard back within a few days. You may see your results on mychart before we have a chance to review them but we will give you a call once they are reviewed by [place]. If we ordered any referrals today, please let us know if you have not heard from their office within the next week.   Please try these tips to maintain a healthy lifestyle:   Eat at least 3 REAL meals and 1-2 snacks per day.  Aim for no more than 5 hours between eating.  If you eat breakfast, please do so within one hour of getting up.    Each meal should contain half fruits/vegetables, one quarter protein, and one quarter carbs (no bigger than a computer mouse)   Cut down on sweet beverages. This includes juice, soda, and sweet tea.     Drink at least 1 glass of water with each meal and aim for at least 8 glasses per day   Exercise at least 150 minutes every week.

## 2021-04-15 NOTE — Progress Notes (Incomplete)
***In Progress*** PCP: Dr. Jacquiline Doe HF Cardiologist: Dr. 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 Recently stopped ACEi due to cough.  Presented to [facility] 2/10/22with increased SOB. He was urgently intubated in the ED. CTA with possible ARDS, CXR concerning for pulmonary edema. Echoshowed severely reduced LVEF <20%, normal RV, and small pericardial effusion.  Ultimately placed on milrinone for cardiogenic shock and diuresed w/ IV furosemide. Extubated 02/09/21.GDMT initiated.R/LHC, on 0.125 of milrinone, showed normal cors and well compensated hemodynamics. CI 2.6. Findings c/w NICM, felt most likely secondary to HTN. cMRIdifficult read with hiccups but felt to be NICM. Discharge weight 171 lbs.  He returned for HF follow up on 04/05/21. Overall was feeling fine. Denied SOB/PND/Orthopnea. Appetite ok. No fever or chills. Was walking 5-6 times a day with his dog. Weight at home 163-167 lbs. He was monitoring BP at home and if SBP <110 he would hold his medications. Stated he felt poorly if SBP <110. Was taking all medications. Lives with his wife.  Today he returns to HF clinic for pharmacist medication titration. At last visit with APP, no medication changes were made. ***     Overall feeling ***. Dizziness, lightheadedness, fatigue:  Chest pain or palpitations:  How is your breathing?: *** SOB: Able to complete all ADLs. Activity level ***  Weight at home pounds. Takes furosemide/torsemide/bumex *** mg *** daily.  LEE PND/Orthopnea  Appetite *** Low-salt diet:   Physical Exam Cost/affordability of meds    -no BMET today (last BMET 4/7 stable, no med changes since) -incr carvedilol to 6.25 mg BID if euvolemic/feeling well - HR 77, RV fxn moderately reduced -prob cannot incr BiDil due to lower BP and feeling poorly if SBP <110 -f/u with pharmacy in 4 weeks for potential further titration of  carvedilol     HF Medications: Carvedilol 3.125 mg BID Entresto 97/103 mg BID Spironolactone 25 mg daily Farxiga 10 mg daily BiDil 10/18.75 mg (half tablet) TID Digoxin 0.125 mg daily  Has the patient been experiencing any side effects to the medications prescribed?  {YES NO:22349}  Does the patient have any problems obtaining medications due to transportation or finances?   {YES NO:22349}  Understanding of regimen: {excellent/good/fair/poor:19665} Understanding of indications: {excellent/good/fair/poor:19665} Potential of compliance: {excellent/good/fair/poor:19665} Patient understands to avoid NSAIDs. Patient understands to avoid decongestants.    Pertinent Lab Values: . 02/21/21: Digoxin 0.3  . 04/05/21: Serum creatinine 0.95, Potassium 4.6  Vital Signs: . Weight: *** (last clinic weight: 172 lbs) . Blood pressure: ***  . Heart rate: ***   Assessment/Plan: 1. Chronic Systolic Heart Failure - Echo EF < 20% RV moderate HK. No previous history of coronary disease. -02/08/21  R/LHC this admit w/ normal cors, Severe NICM EF ~20% likely due to HTN.  - cMRI (2/22): difficult read with hiccups but felt to be NICM. - NYHA I.  Volume status stable.He has not taken medications this morning. - Continue carvedilol 3.125 mg BID - Continue Entresto 97/103 mg BID - Continue spironolactone 25 mg daily - Continue Farxiga 10 mg daily - Continue BiDil 10/18.75 mg (half tablet) TID - Continue digoxin 0.125 mg daily - Repeat ECHO in 6-8 weeks with Dr Gala Romney.   2. HTN -Stable.   3. DMII - hgb A1c 6.0. Having low blood sugars when he takes his Tradjenta with his glyburide. - Continue Farxiga.  4. HLD - Continue atorvastatin. - Followed by PCP.  Lucina Mellow (PharmD Candidate 2022)  Karle Plumber, PharmD, BCPS, BCCP, CPP Heart Failure Clinic Pharmacist [phone]

## 2021-04-16 ENCOUNTER — Other Ambulatory Visit (HOSPITAL_COMMUNITY): Payer: Self-pay

## 2021-04-16 ENCOUNTER — Ambulatory Visit (HOSPITAL_COMMUNITY)
Admission: RE | Admit: 2021-04-16 | Discharge: 2021-04-16 | Disposition: A | Discharge status: Home / Self Care | Payer: Medicare Other | Source: Ambulatory Visit | Attending: Adult Health | Admitting: Adult Health

## 2021-04-16 ENCOUNTER — Other Ambulatory Visit: Payer: Self-pay

## 2021-04-16 VITALS — BP 124/70 | HR 70 | Wt 181.4 lb

## 2021-04-16 DIAGNOSIS — E785 Hyperlipidemia, unspecified: Secondary | ICD-10-CM | POA: Diagnosis not present

## 2021-04-16 DIAGNOSIS — Z79899 Other long term (current) drug therapy: Secondary | ICD-10-CM | POA: Insufficient documentation

## 2021-04-16 DIAGNOSIS — Z5181 Encounter for therapeutic drug level monitoring: Secondary | ICD-10-CM | POA: Insufficient documentation

## 2021-04-16 DIAGNOSIS — Z7984 Long term (current) use of oral hypoglycemic drugs: Secondary | ICD-10-CM | POA: Diagnosis not present

## 2021-04-16 DIAGNOSIS — E119 Type 2 diabetes mellitus without complications: Secondary | ICD-10-CM | POA: Diagnosis not present

## 2021-04-16 DIAGNOSIS — I5022 Chronic systolic (congestive) heart failure: Secondary | ICD-10-CM | POA: Insufficient documentation

## 2021-04-16 DIAGNOSIS — I110 Hypertensive heart disease with heart failure: Secondary | ICD-10-CM | POA: Insufficient documentation

## 2021-04-16 MED ORDER — ISOSORB DINITRATE-HYDRALAZINE 20-37.5 MG PO TABS: 1.0000 | ORAL_TABLET | Freq: Three times a day (TID) | ORAL | 11 refills | Status: DC

## 2021-04-16 NOTE — Patient Instructions (Signed)
It was a pleasure seeing you today!  MEDICATIONS: -We are changing your medications today -Increase BiDil to 1 full tablet three times daily -Call if you have questions about your medications.  LABS: -We will call you if your labs need attention.  NEXT APPOINTMENT: Return to clinic in 2 months with Dr. Gala Romney.  In general, to take care of your heart failure: -Limit your fluid intake to 2 Liters (half-gallon) per day.   -Limit your salt intake to ideally 2-3 grams (2000-3000 mg) per day. -Weigh yourself daily and record, and bring that "weight diary" to your next appointment.  (Weight gain of 2-3 pounds in 1 day typically means fluid weight.) -The medications for your heart are to help your heart and help you live longer.   -Please contact us before stopping any of your heart medications.  Call the clinic at [phone] with questions or to reschedule future appointments.

## 2021-04-16 NOTE — Progress Notes (Signed)
PCP: Dr. Jacquiline Doe HF Cardiologist: Dr. 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 Recently stopped ACEi due to cough (lisinopril stopped 01/08/21).  Presented to [facility] 2/10/22with increased SOB. He was urgently intubated in the ED. CTA with possible ARDS, CXR concerning for pulmonary edema. Echoshowed severely reduced LVEF <20%, RV function moderately reduced, and small pericardial effusion.  Ultimately placed on milrinone for cardiogenic shock and diuresed w/ IV furosemide. Extubated 02/09/21. GDMT initiated.R/LHC showed normal cors and well compensated hemodynamics on milrinone 0.125 mcg/kg/hr, CI 2.6. Findings c/w NICM, felt most likely secondary to HTN. cMRIdifficult read with hiccups but felt to be NICM. Discharge weight 171 lbs.   He returned for HF follow up on 04/05/21. Overall was feeling fine. Denied SOB/PND/Orthopnea. Appetite was ok. No fever or chills. Was walking 5-6 times a day with his dog. Weight at home 163-167 lbs. He was monitoring BP at home and if SBP <110 he would hold his medications. Stated he felt poorly if SBP <110. Lives with his wife.  Today he returns to HF clinic for pharmacist medication titration. At last visit with APP, no medication changes were made. Overall he is feeling well except for concern for mild hernia in lower abdomen which he is planning to follow up with PCP. He reports some dizziness, about 1-2 times per week especially in the mornings after taking his medications if he stands up too quickly. Reports he has not skipped any of his medications since his previous visit. Denies chest pain or palpitations. Reports no SOB even with his frequent walks and states he walks at a quick pace. Denies LEE, PND, orthopnea. Patient's weight is 181 lbs today, up 9 lbs from previous visit. Does not attribute this to fluid overload and thinks it is because he is eating more. He reports losing  a lot of weight during his recent hospitalization and is now gaining some of it back with improved appetite. Adheres to low-salt diet (<2000 mg daily). Patient is euvolemic on exam today. Reports systolic BP at home usually 110-120 mmHg after taking medications.  HF Medications: Carvedilol 3.125 mg BID Entresto 97/103 mg BID Spironolactone 25 mg daily Farxiga 10 mg daily BiDil 1/2 tablet TID Digoxin 0.125 mg daily  Has the patient been experiencing any side effects to the medications prescribed?  No  Does the patient have any problems obtaining medications due to transportation or finances?   No - Has Medicare Part D insurance  Understanding of regimen: excellent Understanding of indications: good Potential of compliance: excellent Patient understands to avoid NSAIDs. Patient understands to avoid decongestants.    Pertinent Lab Values: . 02/21/21: Digoxin 0.3 ng/mL . 04/05/21: Serum creatinine 0.95, Potassium 4.6  Vital Signs: . Weight: 181 lbs (last clinic weight: 172 lbs) . Blood pressure: 124/70  . Heart rate: 70   Assessment/Plan: 1. Chronic Systolic Heart Failure - Echo EF < 20% RV moderate HK. No previous history of coronary disease. -02/08/21  R/LHC this admit w/ normal cors, Severe NICM EF ~20% likely due to HTN.  - cMRI (2/22): difficult read with hiccups but felt to be NICM. - NYHA I symptoms. Euvolemic on exam. Weight up 9 lbs since last clinic visit but likely due to increased appetite. - BP well-controlled, took morning meds today - Continue carvedilol 3.125 mg BID - Continue Entresto 97/103 mg BID - Continue spironolactone 25 mg daily - Continue Farxiga 10 mg daily -  Increase BiDil 20/37.5 mg to 1 tablet TID - Continue digoxin 0.125 mg daily - Follow-up office visit and repeat ECHO in 2 months with Dr Gala Romney.   2. HTN -Improved and stable.  -Increase BiDil and continue carvedilol, Entresto, and spironolactone as above.   3. DMII - hgb A1c 6.0. Had  low blood sugars when he takes his Tradjenta with his glyburide. This has resolved with discontinuation of glyburide. - Continue Farxiga.  4. HLD - Continue atorvastatin. - Followed by PCP.  Patient will follow up with Dr. Gala Romney in 8 weeks on 06/11/21.   Lucina Mellow (PharmD Candidate 2022)  Tama Headings, PharmD, BCPS PGY2 Cardiology Pharmacy Resident  Karle Plumber, PharmD, BCPS, [facility], CPP Heart Failure Clinic Pharmacist [phone]

## 2021-04-19 ENCOUNTER — Other Ambulatory Visit: Payer: Self-pay

## 2021-04-19 ENCOUNTER — Ambulatory Visit (INDEPENDENT_AMBULATORY_CARE_PROVIDER_SITE_OTHER): Payer: Medicare Other | Admitting: Family Medicine

## 2021-04-19 ENCOUNTER — Encounter: Payer: Self-pay | Admitting: Family Medicine

## 2021-04-19 VITALS — BP 101/58 | HR 75 | Temp 98.2°F | Ht 72.0 in | Wt 183.2 lb

## 2021-04-19 DIAGNOSIS — K4090 Unilateral inguinal hernia, without obstruction or gangrene, not specified as recurrent: Secondary | ICD-10-CM | POA: Diagnosis not present

## 2021-04-19 DIAGNOSIS — I10 Essential (primary) hypertension: Secondary | ICD-10-CM

## 2021-04-19 DIAGNOSIS — G588 Other specified mononeuropathies: Secondary | ICD-10-CM | POA: Diagnosis not present

## 2021-04-19 NOTE — Progress Notes (Signed)
   Devin Goodman is a 65 y.o. male who presents today for an office visit.  Assessment/Plan:  New/Acute Problems: Right inguinal hernia No red flags.  No signs of incarceration.  Will place referral to surgery.  Discussed warning signs for incarceration and reasons to return to care or seek emergent care.  Chronic Problems Addressed Today: Peripheral neuropathy Stable.  Recent labs were within normal limits.  Symptoms are currently manageable.  Way consider referral to neurology or trial of gabapentin if continues to be problematic.  Essential hypertension At goal.  Continue Coreg 3.125 mg twice daily, Entresto 97-103 twice daily, spironolactone 25 mg daily and BiDil 3 times daily.    Subjective:  HPI:  Patient here with concern for hernia.  Started a couple weeks ago.  Located in right groin.  Similar to previous hernia in the left groin.  Pain is intermittent.  Worse with coughing or sneezing.  No obvious precipitating events.       Objective:  Physical Exam: BP (!) 101/58   Pulse 75   Temp 98.2 F (36.8 C)   Ht 6' (1.829 m)   Wt 183 lb 3.2 oz (83.1 kg)   SpO2 98%   BMI 24.85 kg/m   Gen: No acute distress, resting comfortably GU: Obvious bulge in the right inguinal crease.  Easily reducible. Neuro: Grossly normal, moves all extremities Psych: Normal affect and thought content      Devin Goodman M. [name], MD 04/19/2021 1:16 PM

## 2021-04-19 NOTE — Assessment & Plan Note (Signed)
Stable.  Recent labs were within normal limits.  Symptoms are currently manageable.  Way consider referral to neurology or trial of gabapentin if continues to be problematic.

## 2021-04-19 NOTE — Assessment & Plan Note (Signed)
At goal.  Continue Coreg 3.125 mg twice daily, Entresto 97-103 twice daily, spironolactone 25 mg daily and BiDil 3 times daily.

## 2021-04-19 NOTE — Patient Instructions (Signed)
It was very nice to see you today!  I will place an urgent referral to a surgeon for your hernia.   Take care, Dr [name]  PLEASE NOTE:  If you had any lab tests please let us know if you have not heard back within a few days. You may see your results on mychart before we have a chance to review them but we will give you a call once they are reviewed by [place]. If we ordered any referrals today, please let us know if you have not heard from their office within the next week.   Please try these tips to maintain a healthy lifestyle:   Eat at least 3 REAL meals and 1-2 snacks per day.  Aim for no more than 5 hours between eating.  If you eat breakfast, please do so within one hour of getting up.    Each meal should contain half fruits/vegetables, one quarter protein, and one quarter carbs (no bigger than a computer mouse)   Cut down on sweet beverages. This includes juice, soda, and sweet tea.     Drink at least 1 glass of water with each meal and aim for at least 8 glasses per day   Exercise at least 150 minutes every week.

## 2021-05-30 ENCOUNTER — Ambulatory Visit: Payer: Self-pay | Admitting: Surgery

## 2021-05-30 ENCOUNTER — Encounter: Payer: Self-pay | Admitting: Surgery

## 2021-05-30 NOTE — H&P (Signed)
Surgical Evaluation  Chief Complaint: hernia  HPI: Very pleasant 65 year old man presents with a right inguinal hernia.  He noticed this about 6 weeks ago.  It is uncomfortable at times but not particularly painful.  Denies any GI symptoms.  Has had a left inguinal hernia repair 20 years ago but no previous hernia surgery on the right side. He has a history of acute heart failure in February requiring ICU admission/intubation, and at the time his ejection fraction was less than 20%.  He has been doing fairly well since discharge, walking his dog a few times each day, compliant with his medications, and following up with his primary team as well as cardiology (Dr. Gala Romney). He has follow-up scheduled 6/13 and is due for repeat echo.    Allergies  Allergen Reactions  . Penicillins Nausea And Vomiting and Other (See Comments)    vomiting    Past Medical History:  Diagnosis Date  . Diabetes mellitus without complication (HCC)   . Hyperlipidemia   . Hypertension     Past Surgical History:  Procedure Laterality Date  . INGUINAL HERNIA REPAIR Left   . RIGHT/LEFT HEART CATH AND CORONARY ANGIOGRAPHY N/A 02/12/2021   Procedure: RIGHT/LEFT HEART CATH AND CORONARY ANGIOGRAPHY;  Surgeon: Dolores Patty, MD;  Location: [facility] INVASIVE CV LAB;  Service: Cardiovascular;  Laterality: N/A;    Family History  Problem Relation Age of Onset  . Cancer Mother     Social History   Socioeconomic History  . Marital status: Married    Spouse name: Not on file  . Number of children: Not on file  . Years of education: Not on file  . Highest education level: Not on file  Occupational History  . Not on file  Tobacco Use  . Smoking status: Never Smoker  . Smokeless tobacco: Never Used  Vaping Use  . Vaping Use: Never used  Substance and Sexual Activity  . Alcohol use: Yes    Comment: occ  . Drug use: Never  . Sexual activity: Not on file  Other Topics Concern  . Not on file  Social History  Narrative  . Not on file   Social Determinants of Health   Financial Resource Strain: Not on file  Food Insecurity: Not on file  Transportation Needs: Not on file  Physical Activity: Not on file  Stress: Not on file  Social Connections: Not on file    Current Outpatient Medications on File Prior to Visit  Medication Sig Dispense Refill  . atorvastatin (LIPITOR) 20 MG tablet Take 1 tablet (20 mg total) by mouth daily. 90 tablet 3  . carvedilol (COREG) 3.125 MG tablet Take 1 tablet (3.125 mg total) by mouth 2 (two) times daily with a meal. 60 tablet 6  . dapagliflozin propanediol (FARXIGA) 10 MG TABS tablet Take 1 tablet (10 mg total) by mouth daily before breakfast. 30 tablet 11  . digoxin (LANOXIN) 0.125 MG tablet Take 1 tablet (0.125 mg total) by mouth daily. 30 tablet 6  . isosorbide-hydrALAZINE (BIDIL) 20-37.5 MG tablet Take 1 tablet by mouth 3 (three) times daily. 90 tablet 11  . linagliptin (TRADJENTA) 5 MG TABS tablet Take 1 tablet (5 mg total) by mouth daily. 30 tablet 5  . sacubitril-valsartan (ENTRESTO) 97-103 MG Take 1 tablet by mouth 2 (two) times daily. 60 tablet 6  . spironolactone (ALDACTONE) 25 MG tablet Take 1 tablet (25 mg total) by mouth daily. 30 tablet 6   No current facility-administered medications on file prior  to visit.    Review of Systems: a complete, 10pt review of systems was completed with pertinent positives and negatives as documented in the HPI General Not Present- Appetite Loss, Chills, Fatigue, Fever, Night Sweats, Weight Gain and Weight Loss. Skin Not Present- Change in Wart/Mole, Dryness, Hives, Jaundice, New Lesions, Non-Healing Wounds, Rash and Ulcer. HEENT Not Present- Earache, Hearing Loss, Hoarseness, Nose Bleed, Oral Ulcers, Ringing in the Ears, Seasonal Allergies, Sinus Pain, Sore Throat, Visual Disturbances, Wears glasses/contact lenses and Yellow Eyes. Respiratory Not Present- Bloody sputum, Chronic Cough, Difficulty Breathing, Snoring and  Wheezing. Breast Not Present- Breast Mass, Breast Pain, Nipple Discharge and Skin Changes. Cardiovascular Not Present- Chest Pain, Difficulty Breathing Lying Down, Leg Cramps, Palpitations, Rapid Heart Rate, Shortness of Breath and Swelling of Extremities. Gastrointestinal Present- Hemorrhoids. Not Present- Abdominal Pain, Bloating, Bloody Stool, Change in Bowel Habits, Chronic diarrhea, Constipation, Difficulty Swallowing, Excessive gas, Gets full quickly at meals, Indigestion, Nausea, Rectal Pain and Vomiting. Male Genitourinary Not Present- Blood in Urine, Change in Urinary Stream, Frequency, Impotence, Nocturia, Painful Urination, Urgency and Urine Leakage. All other systems negative  Vitals  Weight: 175 lb Height: 72in Body Surface Area: 2.01 m Body Mass Index: 23.73 kg/m  Temp.: 97.27F  Pulse: 74 (Regular)  P.OX: 97% (Room air) BP: 136/80(Sitting, Left Arm, Standard)   Physical Exam  Alert, cooperative Unlabored respirations Abdomen soft and nontender. There is a moderate reducible right inguinal hernia which is minimally tender. No lower extremity edema    CBC Latest Ref Rng & Units 02/12/2021 02/12/2021 02/12/2021  WBC 4.0 - 10.5 K/uL 8.1 - -  Hemoglobin 13.0 - 17.0 g/dL 74.2 59.5 63.8  Hematocrit 39.0 - 52.0 % 45.2 43.0 39.0  Platelets 150 - 400 K/uL 149(L) - -    CMP Latest Ref Rng & Units 04/05/2021 02/27/2021 02/21/2021  Glucose 70 - 99 mg/dL 756(E) 332(R) 518(A)  BUN 8 - 23 mg/dL 19 19 13   Creatinine 0.61 - 1.24 mg/dL 4.16 6.06  Sodium 135 - 145 mmol/L 137 140 136  Potassium 3.5 - 5.1 mmol/L 4.6 4.1 4.4  Chloride 98 - 111 mmol/L 105 103 103  CO2 22 - 32 mmol/L 27 28 25   Calcium 8.9 - 10.3 mg/dL 9.6 9.7 9.1  Total Protein 6.5 - 8.1 g/dL - - -  Total Bilirubin 0.3 - 1.2 mg/dL - - -  Alkaline Phos 38 - 126 U/L - - -  AST 15 - 41 U/L - - -  ALT 0 - 44 U/L - - -    Lab Results  Component Value Date   INR 1.0 02/08/2021    Imaging: No results  found.   A/P: RIGHT INGUINAL HERNIA (K40.90) Story: Reducible but symptomatic and of moderate size. I recommend open repair for this unilateral nonrecurrent hernia. We discussed the relevant anatomy and we discussed the technique of the procedure. Discussed risks of bleeding, infection, pain, scarring, injury to structures in the area including nerves, blood vessels, bowel, bladder, risk of chronic pain, hernia recurrence, risk of seroma or hematoma, urinary retention, and risks of general anesthesia including cardiovascular, pulmonary, and thromboembolic complications. Questions were answered. His risk of complications is significantly increased by his cardiac history. He will need cardiac clearance, we will send a request to Dr. office and await results of his repeat echo. Hopefully his cardiac function will have improved.    Patient Active Problem List   Diagnosis Date Noted  . HFrEF (heart failure with reduced ejection fraction) (HCC) 04/06/2021  .  Peripheral neuropathy 04/06/2021  . Type 2 diabetes mellitus with hyperglycemia (HCC) 07/04/2020  . Dyslipidemia 07/04/2020  . Essential hypertension 07/04/2020       Phylliss Blakes, MD [facility]  See AMION to contact appropriate on-call provider

## 2021-05-30 NOTE — H&P (View-Only) (Signed)
Surgical Evaluation  Chief Complaint: hernia  HPI: Very pleasant 65 year old man presents with a right inguinal hernia.  He noticed this about 6 weeks ago.  It is uncomfortable at times but not particularly painful.  Denies any GI symptoms.  Has had a left inguinal hernia repair 20 years ago but no previous hernia surgery on the right side. He has a history of acute heart failure in February requiring ICU admission/intubation, and at the time his ejection fraction was less than 20%.  He has been doing fairly well since discharge, walking his dog a few times each day, compliant with his medications, and following up with his primary team as well as cardiology (Dr. Gala Romney). He has follow-up scheduled 6/13 and is due for repeat echo.    Allergies  Allergen Reactions  . Penicillins Nausea And Vomiting and Other (See Comments)    vomiting    Past Medical History:  Diagnosis Date  . Diabetes mellitus without complication (HCC)   . Hyperlipidemia   . Hypertension     Past Surgical History:  Procedure Laterality Date  . INGUINAL HERNIA REPAIR Left   . RIGHT/LEFT HEART CATH AND CORONARY ANGIOGRAPHY N/A 02/12/2021   Procedure: RIGHT/LEFT HEART CATH AND CORONARY ANGIOGRAPHY;  Surgeon: Dolores Patty, MD;  Location: [facility];  Service: Cardiovascular;  Laterality: N/A;    Family History  Problem Relation Age of Onset  . Cancer Mother     Social History   Socioeconomic History  . Marital status: Married    Spouse name: Not on file  . Number of children: Not on file  . Years of education: Not on file  . Highest education level: Not on file  Occupational History  . Not on file  Tobacco Use  . Smoking status: Never Smoker  . Smokeless tobacco: Never Used  Vaping Use  . Vaping Use: Never used  Substance and Sexual Activity  . Alcohol use: Yes    Comment: occ  . Drug use: Never  . Sexual activity: Not on file  Other Topics Concern  . Not on file  Social History  Narrative  . Not on file   Social Determinants of Health   Financial Resource Strain: Not on file  Food Insecurity: Not on file  Transportation Needs: Not on file  Physical Activity: Not on file  Stress: Not on file  Social Connections: Not on file    Current Outpatient Medications on File Prior to Visit  Medication Sig Dispense Refill  . atorvastatin (LIPITOR) 20 MG tablet Take 1 tablet (20 mg total) by mouth daily. 90 tablet 3  . carvedilol (COREG) 3.125 MG tablet Take 1 tablet (3.125 mg total) by mouth 2 (two) times daily with a meal. 60 tablet 6  . dapagliflozin propanediol (FARXIGA) 10 MG TABS tablet Take 1 tablet (10 mg total) by mouth daily before breakfast. 30 tablet 11  . digoxin (LANOXIN) 0.125 MG tablet Take 1 tablet (0.125 mg total) by mouth daily. 30 tablet 6  . isosorbide-hydrALAZINE (BIDIL) 20-37.5 MG tablet Take 1 tablet by mouth 3 (three) times daily. 90 tablet 11  . linagliptin (TRADJENTA) 5 MG TABS tablet Take 1 tablet (5 mg total) by mouth daily. 30 tablet 5  . sacubitril-valsartan (ENTRESTO) 97-103 MG Take 1 tablet by mouth 2 (two) times daily. 60 tablet 6  . spironolactone (ALDACTONE) 25 MG tablet Take 1 tablet (25 mg total) by mouth daily. 30 tablet 6   No current facility-administered medications on file prior  to visit.    Review of Systems: a complete, 10pt review of systems was completed with pertinent positives and negatives as documented in the HPI General Not Present- Appetite Loss, Chills, Fatigue, Fever, Night Sweats, Weight Gain and Weight Loss. Skin Not Present- Change in Wart/Mole, Dryness, Hives, Jaundice, New Lesions, Non-Healing Wounds, Rash and Ulcer. HEENT Not Present- Earache, Hearing Loss, Hoarseness, Nose Bleed, Oral Ulcers, Ringing in the Ears, Seasonal Allergies, Sinus Pain, Sore Throat, Visual Disturbances, Wears glasses/contact lenses and Yellow Eyes. Respiratory Not Present- Bloody sputum, Chronic Cough, Difficulty Breathing, Snoring and  Wheezing. Breast Not Present- Breast Mass, Breast Pain, Nipple Discharge and Skin Changes. Cardiovascular Not Present- Chest Pain, Difficulty Breathing Lying Down, Leg Cramps, Palpitations, Rapid Heart Rate, Shortness of Breath and Swelling of Extremities. Gastrointestinal Present- Hemorrhoids. Not Present- Abdominal Pain, Bloating, Bloody Stool, Change in Bowel Habits, Chronic diarrhea, Constipation, Difficulty Swallowing, Excessive gas, Gets full quickly at meals, Indigestion, Nausea, Rectal Pain and Vomiting. Male Genitourinary Not Present- Blood in Urine, Change in Urinary Stream, Frequency, Impotence, Nocturia, Painful Urination, Urgency and Urine Leakage. All other systems negative  Vitals  Weight: 175 lb Height: 72in Body Surface Area: 2.01 m Body Mass Index: 23.73 kg/m  Temp.: 97.27F  Pulse: 74 (Regular)  P.OX: 97% (Room air) BP: 136/80(Sitting, Left Arm, Standard)   Physical Exam  Alert, cooperative Unlabored respirations Abdomen soft and nontender. There is a moderate reducible right inguinal hernia which is minimally tender. No lower extremity edema    CBC Latest Ref Rng & Units 02/12/2021 02/12/2021 02/12/2021  WBC 4.0 - 10.5 K/uL 8.1 - -  Hemoglobin 13.0 - 17.0 g/dL 74.2 59.5 63.8  Hematocrit 39.0 - 52.0 % 45.2 43.0 39.0  Platelets 150 - 400 K/uL 149(L) - -    CMP Latest Ref Rng & Units 04/05/2021 02/27/2021 02/21/2021  Glucose 70 - 99 mg/dL 756(E) 332(R) 518(A)  BUN 8 - 23 mg/dL 19 19 13   Creatinine 0.61 - 1.24 mg/dL 4.16 6.06  Sodium 135 - 145 mmol/L 137 140 136  Potassium 3.5 - 5.1 mmol/L 4.6 4.1 4.4  Chloride 98 - 111 mmol/L 105 103 103  CO2 22 - 32 mmol/L 27 28 25   Calcium 8.9 - 10.3 mg/dL 9.6 9.7 9.1  Total Protein 6.5 - 8.1 g/dL - - -  Total Bilirubin 0.3 - 1.2 mg/dL - - -  Alkaline Phos 38 - 126 U/L - - -  AST 15 - 41 U/L - - -  ALT 0 - 44 U/L - - -    Lab Results  Component Value Date   INR 1.0 02/08/2021    Imaging: No results  found.   A/P: RIGHT INGUINAL HERNIA (K40.90) Story: Reducible but symptomatic and of moderate size. I recommend open repair for this unilateral nonrecurrent hernia. We discussed the relevant anatomy and we discussed the technique of the procedure. Discussed risks of bleeding, infection, pain, scarring, injury to structures in the area including nerves, blood vessels, bowel, bladder, risk of chronic pain, hernia recurrence, risk of seroma or hematoma, urinary retention, and risks of general anesthesia including cardiovascular, pulmonary, and thromboembolic complications. Questions were answered. His risk of complications is significantly increased by his cardiac history. He will need cardiac clearance, we will send a request to Dr. office and await results of his repeat echo. Hopefully his cardiac function will have improved.    Patient Active Problem List   Diagnosis Date Noted  . HFrEF (heart failure with reduced ejection fraction) (HCC) 04/06/2021  .  Peripheral neuropathy 04/06/2021  . Type 2 diabetes mellitus with hyperglycemia (HCC) 07/04/2020  . Dyslipidemia 07/04/2020  . Essential hypertension 07/04/2020       Phylliss Blakes, MD [facility]  See AMION to contact appropriate on-call provider

## 2021-06-11 ENCOUNTER — Ambulatory Visit (HOSPITAL_COMMUNITY)
Admission: RE | Admit: 2021-06-11 | Discharge: 2021-06-11 | Disposition: A | Discharge status: Home / Self Care | Payer: Medicare Other | Source: Ambulatory Visit | Attending: Family Medicine | Admitting: Family Medicine

## 2021-06-11 ENCOUNTER — Other Ambulatory Visit: Payer: Self-pay | Admitting: Family Medicine

## 2021-06-11 ENCOUNTER — Ambulatory Visit (HOSPITAL_BASED_OUTPATIENT_CLINIC_OR_DEPARTMENT_OTHER)
Admission: RE | Admit: 2021-06-11 | Discharge: 2021-06-11 | Disposition: A | Discharge status: Home / Self Care | Payer: Medicare Other | Source: Ambulatory Visit | Attending: Internal Medicine | Admitting: Internal Medicine

## 2021-06-11 ENCOUNTER — Other Ambulatory Visit: Payer: Self-pay

## 2021-06-11 ENCOUNTER — Encounter (HOSPITAL_COMMUNITY): Payer: Self-pay | Admitting: Internal Medicine

## 2021-06-11 VITALS — BP 118/70 | HR 66 | Wt 179.6 lb

## 2021-06-11 DIAGNOSIS — E785 Hyperlipidemia, unspecified: Secondary | ICD-10-CM | POA: Insufficient documentation

## 2021-06-11 DIAGNOSIS — I5020 Unspecified systolic (congestive) heart failure: Secondary | ICD-10-CM

## 2021-06-11 DIAGNOSIS — Z7984 Long term (current) use of oral hypoglycemic drugs: Secondary | ICD-10-CM | POA: Insufficient documentation

## 2021-06-11 DIAGNOSIS — E119 Type 2 diabetes mellitus without complications: Secondary | ICD-10-CM

## 2021-06-11 DIAGNOSIS — Z79899 Other long term (current) drug therapy: Secondary | ICD-10-CM | POA: Insufficient documentation

## 2021-06-11 DIAGNOSIS — I110 Hypertensive heart disease with heart failure: Secondary | ICD-10-CM | POA: Insufficient documentation

## 2021-06-11 DIAGNOSIS — I358 Other nonrheumatic aortic valve disorders: Secondary | ICD-10-CM | POA: Insufficient documentation

## 2021-06-11 DIAGNOSIS — N481 Balanitis: Secondary | ICD-10-CM

## 2021-06-11 DIAGNOSIS — I10 Essential (primary) hypertension: Secondary | ICD-10-CM

## 2021-06-11 DIAGNOSIS — I509 Heart failure, unspecified: Secondary | ICD-10-CM

## 2021-06-11 DIAGNOSIS — I5022 Chronic systolic (congestive) heart failure: Secondary | ICD-10-CM | POA: Insufficient documentation

## 2021-06-11 LAB — ECHOCARDIOGRAM COMPLETE
Area-P 1/2: 1.79 cm2
Calc EF: 50.5 %
S' Lateral: 4 cm
Single Plane A2C EF: 54.5 %
Single Plane A4C EF: 46 %

## 2021-06-11 LAB — BASIC METABOLIC PANEL
Anion gap: 4 — ABNORMAL LOW (ref 5–15)
BUN: 15 mg/dL (ref 8–23)
CO2: 31 mmol/L (ref 22–32)
Calcium: 9.5 mg/dL (ref 8.9–10.3)
Chloride: 103 mmol/L (ref 98–111)
Creatinine, Ser: 0.86 mg/dL (ref 0.61–1.24)
GFR, Estimated: >60 mL/min (ref >60)
Glucose, Bld: 79 mg/dL (ref 70–99)
Potassium: 4.8 mmol/L (ref 3.5–5.1)
Sodium: 138 mmol/L (ref 135–145)

## 2021-06-11 LAB — BRAIN NATRIURETIC PEPTIDE: B Natriuretic Peptide: 51.4 pg/mL (ref 0.0–100.0)

## 2021-06-11 MED ORDER — FLUCONAZOLE 150 MG PO TABS: 150.0000 mg | ORAL_TABLET | Freq: Every day | ORAL | 0 refills | Status: DC

## 2021-06-11 NOTE — Progress Notes (Signed)
  Echocardiogram 2D Echocardiogram has been performed.  Devin Goodman 06/11/2021, 10:45 AM

## 2021-06-11 NOTE — Patient Instructions (Signed)
Take Diflucan 150 mg Daily FOR 2 DAYS ONLY, may repeat if needed  **If yeast infection continues or returns please let us know**  Please call our office in February 2023 to schedule your follow up appointment  If you have any questions or concerns before your next appointment please send [place] a message through [place] or call our office at [phone].    TO LEAVE A MESSAGE FOR THE NURSE SELECT OPTION 2, PLEASE LEAVE A MESSAGE INCLUDING: YOUR NAME DATE OF BIRTH CALL BACK NUMBER REASON FOR CALL**this is important as we prioritize the call backs  YOU WILL RECEIVE A CALL BACK THE SAME DAY AS LONG AS YOU CALL BEFORE 4:00 PM  At the [facility], you and your health needs are our priority. As part of our continuing mission to provide you with exceptional heart care, we have created designated Provider Care Teams. These Care Teams include your primary Cardiologist (physician) and Advanced Practice Providers (APPs- Physician Assistants and Nurse Practitioners) who all work together to provide you with the care you need, when you need it.   You may see any of the following providers on your designated Care Team at your next follow up: Dr Arvilla Meres Dr Marca Ancona Dr Brandon Melnick, NP Robbie Lis, [place] Mikki Santee Karle Plumber, PharmD   Please be sure to bring in all your medications bottles to every appointment.

## 2021-06-11 NOTE — Addendum Note (Signed)
Encounter addended by: Noralee Space, RN on: 06/11/2021 12:54 PM  Actions taken: Order list changed, Diagnosis association updated, Pharmacy for encounter modified, Clinical Note Signed, Charge Capture section accepted

## 2021-06-11 NOTE — Progress Notes (Signed)
[facility] Note  PCP: Dr. Jacquiline Goodman HF Cardiologist: Dr. 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 Recently stopped ACEi due to cough.   Presented to [facility] ED on 02/08/21 with acute HF and respiratory distress. He was urgently intubated in the ED. CTA with possible ARDS, CXR concerning for pulmonary edema. Echo showed severely reduced LVEF <20%, normal RV, and small pericardial effusion.  Ultimately placed on milrinone for cardiogenic shock and diuresed w/ IV Lasix. Extubated 02/09/21.GDMT initiated. R/LHC, on 0.125 of milrinone, showed normal cors and well compensated hemodynamics. CI 2.6. Findings c/w NICM, felt most likely 2/2 HTN. cMRI difficult read with hiccups but felt to be NICM. Discharge weight 171 lbs.  Here for f/u. Feeling much better.  Doing all his activities without problem. No Goodman, orthopnea or PND. No edema. Worried Devin Goodman is causing penile yeast infection. No dysuria or discharge.   Echo today 06/11/21: EF 50-55% Personally reviewed  Cardiac Studies: cMRI (2/22): Basal septal midwall LGE, which is a scar pattern seen in nonischemic cardiomyopathies. These findings suggest acute myocarditis.  Echo (2/22): EF < 20% RV moderate HK   R/LHC (2/22): Normal cors Findings: On milrinone 0.125 mcg/kg/min  RA = 2  RV = 30/2 PA = 31/8 (17) PCW = 5 Fick cardiac output/index = 5.3/2.6 PVR = 2.3 WU Ao sat = 97% PA sat = 70%, 74%  ROS: All systems negative except as listed in HPI, PMH and Problem List.  SH:  Social History   Socioeconomic History   Marital status: Married    Spouse name: Not on file   Number of children: Not on file   Years of education: Not on file   Highest education level: Not on file  Occupational History   Not on file  Tobacco Use   Smoking status: Never   Smokeless tobacco: Never  Vaping Use   Vaping Use: Never used  Substance and Sexual Activity    Alcohol use: Yes    Comment: occ   Drug use: Never   Sexual activity: Not on file  Other Topics Concern   Not on file  Social History Narrative   Not on file   Social Determinants of Health   Financial Resource Strain: Not on file  Food Insecurity: Not on file  Transportation Needs: Not on file  Physical Activity: Not on file  Stress: Not on file  Social Connections: Not on file  Intimate Partner Violence: Not on file   FH:  Family History  Problem Relation Age of Onset   Cancer Mother    Past Medical History:  Diagnosis Date   Diabetes mellitus without complication (HCC)    Hyperlipidemia    Hypertension    Current Outpatient Medications  Medication Sig Dispense Refill   atorvastatin (LIPITOR) 20 MG tablet Take 1 tablet (20 mg total) by mouth daily. 90 tablet 3   carvedilol (COREG) 3.125 MG tablet Take 1 tablet (3.125 mg total) by mouth 2 (two) times daily with a meal. 60 tablet 6   dapagliflozin propanediol (FARXIGA) 10 MG TABS tablet Take 1 tablet (10 mg total) by mouth daily before breakfast. 30 tablet 11   digoxin (LANOXIN) 0.125 MG tablet Take 1 tablet (0.125 mg total) by mouth daily. 30 tablet 6   isosorbide-hydrALAZINE (BIDIL) 20-37.5 MG tablet Take 1 tablet by mouth 3 (three) times daily. 90 tablet 11   linagliptin (TRADJENTA) 5 MG  TABS tablet Take 1 tablet (5 mg total) by mouth daily. 30 tablet 5   sacubitril-valsartan (ENTRESTO) 97-103 MG Take 1 tablet by mouth 2 (two) times daily. 60 tablet 6   spironolactone (ALDACTONE) 25 MG tablet Take 1 tablet (25 mg total) by mouth daily. 30 tablet 6   No current facility-administered medications for this encounter.    Vitals:   06/11/21 1138  BP: 118/70  Pulse: 66  SpO2: 99%  Weight: 81.5 kg (179 lb 9.6 oz)   Wt Readings from Last 3 Encounters:  06/11/21 81.5 kg (179 lb 9.6 oz)  04/19/21 83.1 kg (183 lb 3.2 oz)  04/16/21 82.3 kg (181 lb 6.4 oz)   PHYSICAL EXAM: General:  Well appearing. No resp  difficulty HEENT: normal Neck: supple. no JVD. Carotids 2+ bilat; no bruits. No lymphadenopathy or thryomegaly appreciated. Cor: PMI nondisplaced. Regular rate & rhythm. No rubs, gallops or murmurs. Lungs: clear Abdomen: soft, nontender, nondistended. No hepatosplenomegaly. No bruits or masses. Good bowel sounds. Extremities: no cyanosis, clubbing, rash, edema Mild balantitis Neuro: alert & orientedx3, cranial nerves grossly intact. moves all 4 extremities w/o difficulty. Affect pleasant   ASSESSMENT & PLAN:   1. Chronic Systolic Heart Failure - Echo EF < 20% RV moderate HK . No previous history of coronary disease.   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  - cMRI (2/22): difficult read with hiccups but felt to be NICM. LVEF "severely reduced" - Echo today 06/11/21: EF 50-55% Personally reviewed - EF with near full recovery - NYHA I. Volume status ok  - Continue  Farxiga 10 mg daily. - Continue spiro 25 mg daily. - Continue Entresto 97-103 mg bid.  - Continue Coreg 3.125 mg bid.  - Continue Bidil 1/2 tablet tid.  - stop digoxin.  - Labs today - See back in 9 months. If stable can [facility] to [facility]  2. HTN -Blood pressure well controlled. Continue current regimen.   3. DMII - hgb A1c 6.0. Having low blood sugars when he takes his Tradjenta with his glyburide. - Continue Farxiga.  4. HLD - Continue atorva. - Followed by PCP.  5. Balantitis - fluconazole 150mg  x 2 days - keep area dry    Devin Goodman 12:24 PM

## 2021-06-13 ENCOUNTER — Encounter (HOSPITAL_COMMUNITY)
Admission: RE | Admit: 2021-06-13 | Discharge: 2021-06-13 | Disposition: A | Discharge status: Home / Self Care | Payer: Medicare Other | Source: Ambulatory Visit | Attending: Surgery | Admitting: Surgery

## 2021-06-13 ENCOUNTER — Other Ambulatory Visit: Payer: Self-pay

## 2021-06-13 ENCOUNTER — Encounter (HOSPITAL_COMMUNITY): Payer: Self-pay

## 2021-06-13 DIAGNOSIS — Z79899 Other long term (current) drug therapy: Secondary | ICD-10-CM | POA: Insufficient documentation

## 2021-06-13 DIAGNOSIS — K4090 Unilateral inguinal hernia, without obstruction or gangrene, not specified as recurrent: Secondary | ICD-10-CM | POA: Insufficient documentation

## 2021-06-13 DIAGNOSIS — I5022 Chronic systolic (congestive) heart failure: Secondary | ICD-10-CM | POA: Diagnosis not present

## 2021-06-13 DIAGNOSIS — E119 Type 2 diabetes mellitus without complications: Secondary | ICD-10-CM | POA: Diagnosis not present

## 2021-06-13 DIAGNOSIS — E785 Hyperlipidemia, unspecified: Secondary | ICD-10-CM | POA: Diagnosis not present

## 2021-06-13 DIAGNOSIS — I110 Hypertensive heart disease with heart failure: Secondary | ICD-10-CM | POA: Insufficient documentation

## 2021-06-13 DIAGNOSIS — Z01812 Encounter for preprocedural laboratory examination: Secondary | ICD-10-CM | POA: Insufficient documentation

## 2021-06-13 DIAGNOSIS — I428 Other cardiomyopathies: Secondary | ICD-10-CM | POA: Insufficient documentation

## 2021-06-13 HISTORY — DX: Heart failure, unspecified: I50.9

## 2021-06-13 LAB — CBC
HCT: 44 % (ref 39.0–52.0)
Hemoglobin: 13.7 g/dL (ref 13.0–17.0)
MCH: 26.4 pg (ref 26.0–34.0)
MCHC: 31.1 g/dL (ref 30.0–36.0)
MCV: 84.8 fL (ref 80.0–100.0)
Platelets: 265 10*3/uL (ref 150–400)
RBC: 5.19 MIL/uL (ref 4.22–5.81)
RDW: 14.4 % (ref 11.5–15.5)
WBC: 4.7 10*3/uL (ref 4.0–10.5)
nRBC: 0 % (ref 0.0–0.2)

## 2021-06-13 LAB — GLUCOSE, CAPILLARY: Glucose-Capillary: 113 mg/dL — ABNORMAL HIGH (ref 70–99)

## 2021-06-13 NOTE — Progress Notes (Signed)
PCP - Dr. Lupita Shutter- [facility] Cardiologist - Dr. Arvilla Meres  PPM/ICD - n/a Device Orders - n/a Rep Notified - n/a  Chest x-ray - 02/09/21 EKG - 06/11/21 Stress Test - denies ECHO - 06/11/21 Cardiac Cath - 02/12/21  Sleep Study - denies CPAP - n/a  Fasting Blood Sugar - 113 Checks Blood Sugar- Patient states he does not regularly check his blood sugar. States he checks it approximately once a week or less. Patient states when he does check his BS it usually 110-120.  Blood Thinner Instructions: n/a Aspirin Instructions: n/a  COVID TEST- n/a. Ambulatory Surgery   Anesthesia review: Yes. Cardiac History. Was seen by Dr. Teressa Lower on 06/11/21  Patient denies shortness of breath, fever, cough and chest pain at PAT appointment   All instructions explained to the patient, with a verbal understanding of the material. Patient agrees to go over the instructions while at home for a better understanding. Patient also instructed to self quarantine after being tested for COVID-19. The opportunity to ask questions was provided.

## 2021-06-13 NOTE — Pre-Procedure Instructions (Signed)
Surgical Instructions    Your procedure is scheduled on Wednesday June 22nd.  Report to [facility] Main Entrance "A" at 1:00 P.M., then check in with the Admitting office.  Call this number if you have problems the morning of surgery:  [phone]   If you have any questions prior to your surgery date call [phone]: Open Monday-Friday 8am-4pm    Remember:  Do not eat or drink after midnight the night before your surgery     Take these medicines the morning of surgery with A SIP OF WATER   atorvastatin (LIPITOR)  carvedilol (COREG)  isosorbide-hydrALAZINE (BIDIL)   As of today, STOP taking any Aspirin (unless otherwise instructed by your surgeon) Aleve, Naproxen, Ibuprofen, Motrin, Advil, Goody's, BC's, all herbal medications, fish oil, and all vitamins.         WHAT DO I DO ABOUT MY DIABETES MEDICATION?   Do not take oral diabetes medicines (pills) the morning of surgery. Do not take dapagliflozin propanediol (FARXIGA) the day before surgery and the morning of surgery.  Do not take linagliptin (TRADJENTA) the day of surgery.  The day of surgery, do not take other diabetes injectables, including Byetta (exenatide), Bydureon (exenatide ER), Victoza (liraglutide), or Trulicity (dulaglutide).   HOW TO MANAGE YOUR DIABETES BEFORE AND AFTER SURGERY  Why is it important to control my blood sugar before and after surgery? Improving blood sugar levels before and after surgery helps healing and can limit problems. A way of improving blood sugar control is eating a healthy diet by:  Eating less sugar and carbohydrates  Increasing activity/exercise  Talking with your doctor about reaching your blood sugar goals High blood sugars (greater than 180 mg/dL) can raise your risk of infections and slow your recovery, so you will need to focus on controlling your diabetes during the weeks before surgery. Make sure that the doctor who takes care of your diabetes knows about your planned  surgery including the date and location.  How do I manage my blood sugar before surgery? Check your blood sugar at least 4 times a day, starting 2 days before surgery, to make sure that the level is not too high or low.  Check your blood sugar the morning of your surgery when you wake up and every 2 hours until you get to the Short Stay unit.  If your blood sugar is less than 70 mg/dL, you will need to treat for low blood sugar: Do not take insulin. Treat a low blood sugar (less than 70 mg/dL) with  cup of clear juice (cranberry or apple), 4 glucose tablets, OR glucose gel. Recheck blood sugar in 15 minutes after treatment (to make sure it is greater than 70 mg/dL). If your blood sugar is not greater than 70 mg/dL on recheck, call [phone] for further instructions. Report your blood sugar to the short stay nurse when you get to Short Stay.  If you are admitted to the hospital after surgery: Your blood sugar will be checked by the staff and you will probably be given insulin after surgery (instead of oral diabetes medicines) to make sure you have good blood sugar levels. The goal for blood sugar control after surgery is 80-180 mg/dL.             Do NOT Smoke (Tobacco/Vaping) or drink Alcohol 24 hours prior to your procedure.  If you use a CPAP at night, you may bring all equipment for your overnight stay.   Contacts, glasses, piercing's, hearing aid's, dentures or  partials may not be worn into surgery, please bring cases for these belongings.    For patients admitted to the hospital, discharge time will be determined by your treatment team.   Patients discharged the day of surgery will not be allowed to drive home, and someone needs to stay with them for 24 hours.    Special instructions:   [facility]- Preparing For Surgery  Before surgery, you can play an important role. Because skin is not sterile, your skin needs to be as free of germs as possible. You can reduce the number of  germs on your skin by washing with CHG (chlorahexidine gluconate) Soap before surgery.  CHG is an antiseptic cleaner which kills germs and bonds with the skin to continue killing germs even after washing.    Oral Hygiene is also important to reduce your risk of infection.  Remember - BRUSH YOUR TEETH THE MORNING OF SURGERY WITH YOUR REGULAR TOOTHPASTE  Please do not use if you have an allergy to CHG or antibacterial soaps. If your skin becomes reddened/irritated stop using the CHG.  Do not shave (including legs and underarms) for at least 48 hours prior to first CHG shower. It is OK to shave your face.  Please follow these instructions carefully.   Shower the NIGHT BEFORE SURGERY and the MORNING OF SURGERY  If you chose to wash your hair, wash your hair first as usual with your normal shampoo.  After you shampoo, rinse your hair and body thoroughly to remove the shampoo.  Use CHG Soap as you would any other liquid soap. You can apply CHG directly to the skin and wash gently with a scrungie or a clean washcloth.   Apply the CHG Soap to your body ONLY FROM THE NECK DOWN.  Do not use on open wounds or open sores. Avoid contact with your eyes, ears, mouth and genitals (private parts). Wash Face and genitals (private parts)  with your normal soap.   Wash thoroughly, paying special attention to the area where your surgery will be performed.  Thoroughly rinse your body with warm water from the neck down.  DO NOT shower/wash with your normal soap after using and rinsing off the CHG Soap.  Pat yourself dry with a CLEAN TOWEL.  Wear CLEAN PAJAMAS to bed the night before surgery  Place CLEAN SHEETS on your bed the night before your surgery  DO NOT SLEEP WITH PETS.   Day of Surgery: Shower with CHG soap. Do not wear jewelry, make up, or nail polish Do not wear lotions, powders, perfumes/colognes, or deodorant. Do not shave 48 hours prior to surgery.  Men may shave face and neck. Do not  bring valuables to the hospital. [facility] is not responsible for any belongings or valuables. Wear Clean/Comfortable clothing the morning of surgery Remember to brush your teeth WITH YOUR REGULAR TOOTHPASTE.   Please read over the following fact sheets that you were given.

## 2021-06-14 LAB — HEMOGLOBIN A1C
Hgb A1c MFr Bld: 6.3 % — ABNORMAL HIGH (ref 4.8–5.6)
Mean Plasma Glucose: 134 mg/dL

## 2021-06-14 NOTE — Progress Notes (Signed)
Anesthesia Chart Review: Devin Goodman  Case: [id number] Date/Time: 06/20/21 1445   Procedure: OPEN RIGHT INGUINAL HERNIA REPAIR (Right)   Anesthesia type: General   Pre-op diagnosis: RIGHT INGUINAL HERNIA   Location: [facility] ROOM 10 / [facility] OR   Surgeons: Berna Bue, MD       DISCUSSION: Patient is a 65 year old male scheduled for the above procedure.  History includes never smoker, HTN, DM2, HLD, chronic systolic CHF, non-ischemic cardiomyopathy (01/2021). 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  - Admission 02/08/21-02/14/21 for acute hypoxic respiratory failure requiring intubation (02/08/21-02/09/21), acute systolic heart failure with cardiogenic shock, hypertensive emergency, pulmonary edema, AKI.  He was started on nitroglycerin but developed hypotension tension requiring Neo-Synephrine.  He was ultimately placed on milrinone for cardiogenic shock and diuresed with Lasix.  Echo showed severely reduced LVEF less than 20%, moderately reduced RV systolic function, small pericardial effusion, bilateral pleural effusions.  He was started on guideline directed medical therapy. R/LHC showed normal coronaries, well-compensated hemodynamics on milrinone, NICM felt likely secondary to HTN. Cardiac MRI (difficult study to to frequent hiccups) suggested myocarditis. Milrinone, O2 weaned. BP and volume status improved. Discharged home with follow-up in HF Clinic.  Last follow-up visit with Dr. Gala Romney on 06/11/21. Echo repeated and showed LVEF improved to 50-55%, normal RV systolic function. Volume status okay. On Lipitor, Entresto, Aldactone, Bidil, Farxiga, Coreg. Digoxin stopped. Nine month follow-up is planned. [facility] to fax clearance note to PAT.  Anesthesia team to evaluate on the day of surgery.      VS: BP (!) 114/59   Pulse 65   Temp 36.6 C (Oral)   Resp 17   Ht 6' (1.829 m)   Wt 80.9 kg   SpO2 100%   BMI 24.20 kg/m    PROVIDERS: Ardith Dark, MD is PCP   Arvilla Meres, MD is HF cardiologist   LABS: Currently, last lab results include: Lab Results  Component Value Date   WBC 4.7 06/13/2021   HGB 13.7 06/13/2021   HCT 44.0 06/13/2021   PLT 265 06/13/2021   GLUCOSE 79 06/11/2021   TRIG 63 02/09/2021   ALT 51 (H) 02/08/2021   AST 47 (H) 02/08/2021   NA 138 06/11/2021   K 4.8 06/11/2021   CL 103 06/11/2021   CREATININE 0.86 06/11/2021   BUN 15 06/11/2021   CO2 31 06/11/2021   TSH 1.13 04/06/2021   INR 1.0 02/08/2021   HGBA1C 6.3 (H) 06/13/2021     IMAGES: 1V PCXR 02/09/21: IMPRESSION: Interval improvement in diffuse bilateral airspace disease.  CTA Chest 02/08/21: IMPRESSION: 1. No pulmonary embolus or acute aortic syndrome. 2. Diffuse ground-glass opacity throughout both lungs with large areas of consolidation in the lower lobes, possibly ARDS. 3. Small pleural effusions. - Aortic Atherosclerosis (ICD10-I70.0).   EKG: 06/11/21: Normal sinus rhythm Minimal voltage criteria for LVH, may be normal variant ( Sokolow-Lyon ) Early repolarization Anterior leads Nonspecific T wave abnormality Abnormal ECG No significant change since last tracing Confirmed by Rosemary Holms, Manish ([id number]) on 06/11/2021 12:39:29 PM   CV: Echo 06/11/21: IMPRESSIONS   1. Left ventricular ejection fraction, by estimation, is 50 to 55%. The  left ventricle has low normal function. The left ventricle has no regional  wall motion abnormalities. Left ventricular diastolic parameters are  consistent with Grade I diastolic  dysfunction (impaired relaxation).   2. Right ventricular systolic function is normal. The right ventricular  size is normal. Tricuspid regurgitation signal is inadequate for  assessing  PA pressure.   3. The mitral valve is normal in structure. Trivial mitral valve  regurgitation. No evidence of mitral stenosis.   4. The aortic valve is tricuspid. Aortic valve regurgitation is not  visualized. Mild aortic valve sclerosis is  present, with no evidence of  aortic valve stenosis.   5. Aortic dilatation noted. There is borderline dilatation of the  ascending aorta, measuring 37 mm.   6. The inferior vena cava is normal in size with greater than 50%  respiratory variability, suggesting right atrial pressure of 3 mmHg.  - Comparison echo 02/08/21: LVEF < 20%, global hypokinesis, moderate LVH, moderately reduced RV systolic function, small pericardial effusion, large pleural effusion bilaterally, mild MR.   MR Cardiac morphology 02/13/21: IMPRESSION: 1. Technically difficult study as patient had hiccups throughout study and was unable to do breath holds 2. Unable to quantify volumes due to respiratory motion artifact but visually appeared severe LV systolic dysfunction and moderate RV systolic dysfunction 3. Regional elevation in native T1, T2, and ECV consistent with myocardial edema. Basal septal midwall LGE, which is a scar pattern seen in nonischemic cardiomyopathies. These findings suggest acute myocarditis.   RHC/LHC 02/12/21: Findings: On milrinone 0.125 mcg/kg/min   RA = 2 RV = 30/2 PA = 31/8 (17) PCW = 5 Fick cardiac output/index = 5.3/2.6 PVR = 2.3 WU Ao sat = 97% PA sat = 70%, 74%   Assessment: 1. Normal coronary arteries 2. Severe NICM EF ~20% likely due to HTN 3. Well-compensated hemodynamics on milrinone 0.125   Plan/Discussion: Wean milrinone. Titrate GDMT. Check cMRI.   Past Medical History:  Diagnosis Date   CHF (congestive heart failure) (HCC)    Diabetes mellitus without complication (HCC)    Hyperlipidemia    Hypertension     Past Surgical History:  Procedure Laterality Date   INGUINAL HERNIA REPAIR Left    RIGHT/LEFT HEART CATH AND CORONARY ANGIOGRAPHY N/A 02/12/2021   Procedure: RIGHT/LEFT HEART CATH AND CORONARY ANGIOGRAPHY;  Surgeon: Dolores Patty, MD;  Location: [facility] CV [facility];  Service: Cardiovascular;  Laterality: N/A;    MEDICATIONS:  atorvastatin  (LIPITOR) 20 MG tablet   carvedilol (COREG) 3.125 MG tablet   dapagliflozin propanediol (FARXIGA) 10 MG TABS tablet   digoxin (LANOXIN) 0.125 MG tablet   fluconazole (DIFLUCAN) 150 MG tablet   isosorbide-hydrALAZINE (BIDIL) 20-37.5 MG tablet   linagliptin (TRADJENTA) 5 MG TABS tablet   sacubitril-valsartan (ENTRESTO) 97-103 MG   spironolactone (ALDACTONE) 25 MG tablet   No current facility-administered medications for this encounter.   No longer on digoxin.  Shonna Chock, PA-C Surgical Short Stay/Anesthesiology [facility] Phone [phone] [facility] Phone [phone] 06/14/2021 1:53 PM

## 2021-06-14 NOTE — Anesthesia Preprocedure Evaluation (Addendum)
Anesthesia Evaluation  Patient identified by MRN, date of birth, ID band Patient awake    Reviewed: Allergy & Precautions, NPO status , Patient's Chart, lab work & pertinent test results, reviewed documented beta blocker date and time   Airway Mallampati: II  TM Distance: >3 FB Neck ROM: Full    Dental no notable dental hx. (+) Teeth Intact, Dental Advisory Given   Pulmonary  Admission 02/08/21-02/14/21 for acute hypoxic respiratory failure requiring intubation (02/08/21-02/09/21) 2/2 NICM- resolved   Pulmonary exam normal breath sounds clear to auscultation       Cardiovascular hypertension, Pt. on medications and Pt. on home beta blockers +CHF (NICM with LVEF 20% in Feb 2022 2/2 uncontrolled HTN, now up to 55%)  Normal cardiovascular exam Rhythm:Regular Rate:Normal  Admitted Feb 2022 with acute systolic heart failure with cardiogenic shock, hypertensive emergency, pulmonary edema- EF <20% at the time  Last follow-up visit with Dr. Gala Romney on 06/11/21. Echo repeated and showed LVEF improved to 50-55%, normal RV systolic function. Volume status okay. On Lipitor, Entresto, Aldactone, Bidil, Farxiga, Coreg. Digoxin stopped.   Neuro/Psych negative neurological ROS  negative psych ROS   GI/Hepatic negative GI ROS, Neg liver ROS,   Endo/Other  diabetes, Well Controlled, Type 2, Oral Hypoglycemic AgentsLast a1c 6.3  Renal/GU negative Renal ROS  negative genitourinary   Musculoskeletal negative musculoskeletal ROS (+)   Abdominal   Peds  Hematology negative hematology ROS (+) hct 44   Anesthesia Other Findings   Reproductive/Obstetrics negative OB ROS                           Anesthesia Physical Anesthesia Plan  ASA: 3  Anesthesia Plan: General   Post-op Pain Management:    Induction: Intravenous  PONV Risk Score and Plan: 2 and Dexamethasone, Ondansetron, Midazolam and Treatment may vary due  to age or medical condition  Airway Management Planned: Oral ETT  Additional Equipment: None  Intra-op Plan:   Post-operative Plan: Extubation in OR  Informed Consent: I have reviewed the patients History and Physical, chart, labs and discussed the procedure including the risks, benefits and alternatives for the proposed anesthesia with the patient or authorized representative who has indicated his/her understanding and acceptance.     Dental advisory given  Plan Discussed with: CRNA  Anesthesia Plan Comments: ( )      Anesthesia Quick Evaluation

## 2021-06-20 ENCOUNTER — Other Ambulatory Visit: Payer: Self-pay

## 2021-06-20 ENCOUNTER — Encounter (HOSPITAL_COMMUNITY)
Admission: RE | Disposition: A | Discharge status: Home / Self Care | Payer: Self-pay | Source: Home / Self Care | Attending: Surgery

## 2021-06-20 ENCOUNTER — Encounter (HOSPITAL_COMMUNITY): Payer: Self-pay | Admitting: Surgery

## 2021-06-20 ENCOUNTER — Ambulatory Visit (HOSPITAL_COMMUNITY)
Admission: RE | Admit: 2021-06-20 | Discharge: 2021-06-20 | Disposition: A | Discharge status: Home / Self Care | Payer: Medicare Other | Attending: Surgery | Admitting: Surgery

## 2021-06-20 ENCOUNTER — Ambulatory Visit (HOSPITAL_COMMUNITY): Payer: Medicare Other | Admitting: Vascular Surgery

## 2021-06-20 ENCOUNTER — Ambulatory Visit (HOSPITAL_COMMUNITY): Payer: Medicare Other | Admitting: Anesthesiology

## 2021-06-20 DIAGNOSIS — Z7984 Long term (current) use of oral hypoglycemic drugs: Secondary | ICD-10-CM | POA: Insufficient documentation

## 2021-06-20 DIAGNOSIS — Z880 Allergy status to penicillin: Secondary | ICD-10-CM | POA: Diagnosis not present

## 2021-06-20 DIAGNOSIS — I509 Heart failure, unspecified: Secondary | ICD-10-CM | POA: Insufficient documentation

## 2021-06-20 DIAGNOSIS — E1142 Type 2 diabetes mellitus with diabetic polyneuropathy: Secondary | ICD-10-CM | POA: Insufficient documentation

## 2021-06-20 DIAGNOSIS — K4090 Unilateral inguinal hernia, without obstruction or gangrene, not specified as recurrent: Secondary | ICD-10-CM | POA: Diagnosis present

## 2021-06-20 DIAGNOSIS — Z79899 Other long term (current) drug therapy: Secondary | ICD-10-CM | POA: Diagnosis not present

## 2021-06-20 DIAGNOSIS — I110 Hypertensive heart disease with heart failure: Secondary | ICD-10-CM | POA: Insufficient documentation

## 2021-06-20 HISTORY — PX: INGUINAL HERNIA REPAIR: SHX194

## 2021-06-20 LAB — GLUCOSE, CAPILLARY
Glucose-Capillary: 97 mg/dL (ref 70–99)
Glucose-Capillary: 88 mg/dL (ref 70–99)
Glucose-Capillary: 85 mg/dL (ref 70–99)
Glucose-Capillary: 100 mg/dL — ABNORMAL HIGH (ref 70–99)

## 2021-06-20 SURGERY — REPAIR, HERNIA, INGUINAL, ADULT
Anesthesia: General | Laterality: Right

## 2021-06-20 MED ORDER — LIDOCAINE 2% (20 MG/ML) 5 ML SYRINGE
INTRAMUSCULAR | Status: DC | PRN
  Administered 2021-06-20: 60 mg via INTRAVENOUS

## 2021-06-20 MED ORDER — MIDAZOLAM HCL 2 MG/2ML IJ SOLN
INTRAMUSCULAR | Status: AC
  Filled 2021-06-20: qty 2

## 2021-06-20 MED ORDER — PHENYLEPHRINE HCL-NACL 10-0.9 MG/250ML-% IV SOLN
INTRAVENOUS | Status: DC | PRN
  Administered 2021-06-20: 30 ug/min via INTRAVENOUS

## 2021-06-20 MED ORDER — CEFAZOLIN SODIUM-DEXTROSE 2-4 GM/100ML-% IV SOLN
INTRAVENOUS | Status: AC
  Filled 2021-06-20: qty 100

## 2021-06-20 MED ORDER — BUPIVACAINE LIPOSOME 1.3 % IJ SUSP
20.0000 mL | Freq: Once | INTRAMUSCULAR | Status: DC
  Filled 2021-06-20: qty 20

## 2021-06-20 MED ORDER — BUPIVACAINE LIPOSOME 1.3 % IJ SUSP
INTRAMUSCULAR | Status: AC
  Filled 2021-06-20: qty 20

## 2021-06-20 MED ORDER — OXYCODONE HCL 5 MG PO TABS: 5.0000 mg | ORAL_TABLET | Freq: Three times a day (TID) | ORAL | 0 refills | Status: AC | PRN

## 2021-06-20 MED ORDER — LACTATED RINGERS IV SOLN: INTRAVENOUS | Status: DC

## 2021-06-20 MED ORDER — CHLORHEXIDINE GLUCONATE 4 % EX LIQD: 60.0000 mL | Freq: Once | CUTANEOUS | Status: DC

## 2021-06-20 MED ORDER — BUPIVACAINE-EPINEPHRINE 0.25% -1:200000 IJ SOLN
INTRAMUSCULAR | Status: DC | PRN
  Administered 2021-06-20: 17.5 mL

## 2021-06-20 MED ORDER — CHLORHEXIDINE GLUCONATE 0.12 % MT SOLN
15.0000 mL | Freq: Once | OROMUCOSAL | Status: AC
  Administered 2021-06-20: 15 mL via OROMUCOSAL
  Filled 2021-06-20: qty 15

## 2021-06-20 MED ORDER — ONDANSETRON HCL 4 MG/2ML IJ SOLN
INTRAMUSCULAR | Status: DC | PRN
  Administered 2021-06-20: 4 mg via INTRAVENOUS

## 2021-06-20 MED ORDER — ORAL CARE MOUTH RINSE: 15.0000 mL | Freq: Once | OROMUCOSAL | Status: AC

## 2021-06-20 MED ORDER — MIDAZOLAM HCL 5 MG/5ML IJ SOLN
INTRAMUSCULAR | Status: DC | PRN
  Administered 2021-06-20: 2 mg via INTRAVENOUS

## 2021-06-20 MED ORDER — FENTANYL CITRATE (PF) 250 MCG/5ML IJ SOLN
INTRAMUSCULAR | Status: AC
  Filled 2021-06-20: qty 5

## 2021-06-20 MED ORDER — FENTANYL CITRATE (PF) 250 MCG/5ML IJ SOLN
INTRAMUSCULAR | Status: DC | PRN
  Administered 2021-06-20 (×2): 50 ug via INTRAVENOUS
  Administered 2021-06-20: 100 ug via INTRAVENOUS
  Administered 2021-06-20: 50 ug via INTRAVENOUS

## 2021-06-20 MED ORDER — ACETAMINOPHEN 500 MG PO TABS
1000.0000 mg | ORAL_TABLET | Freq: Once | ORAL | Status: AC
  Administered 2021-06-20: 1000 mg via ORAL
  Filled 2021-06-20: qty 2

## 2021-06-20 MED ORDER — CEFAZOLIN SODIUM-DEXTROSE 2-4 GM/100ML-% IV SOLN
2.0000 g | INTRAVENOUS | Status: AC
  Administered 2021-06-20: 2 g via INTRAVENOUS

## 2021-06-20 MED ORDER — DOCUSATE SODIUM 100 MG PO CAPS: 100.0000 mg | ORAL_CAPSULE | Freq: Two times a day (BID) | ORAL | 0 refills | Status: AC

## 2021-06-20 MED ORDER — ROCURONIUM BROMIDE 10 MG/ML (PF) SYRINGE
PREFILLED_SYRINGE | INTRAVENOUS | Status: DC | PRN
  Administered 2021-06-20: 80 mg via INTRAVENOUS

## 2021-06-20 MED ORDER — SUGAMMADEX SODIUM 200 MG/2ML IV SOLN
INTRAVENOUS | Status: DC | PRN
  Administered 2021-06-20: 200 mg via INTRAVENOUS

## 2021-06-20 MED ORDER — PHENYLEPHRINE 40 MCG/ML (10ML) SYRINGE FOR IV PUSH (FOR BLOOD PRESSURE SUPPORT)
PREFILLED_SYRINGE | INTRAVENOUS | Status: DC | PRN
Start: 2021-06-20 — End: 2021-06-20
  Administered 2021-06-20: 40 ug via INTRAVENOUS
  Administered 2021-06-20 (×3): 80 ug via INTRAVENOUS

## 2021-06-20 MED ORDER — EPHEDRINE SULFATE 50 MG/ML IJ SOLN
INTRAMUSCULAR | Status: DC | PRN
  Administered 2021-06-20: 5 mg via INTRAVENOUS

## 2021-06-20 MED ORDER — ACETAMINOPHEN 500 MG PO TABS: 1000.0000 mg | ORAL_TABLET | Freq: Four times a day (QID) | ORAL | 0 refills | Status: AC

## 2021-06-20 MED ORDER — PROPOFOL 10 MG/ML IV BOLUS
INTRAVENOUS | Status: DC | PRN
  Administered 2021-06-20: 100 mg via INTRAVENOUS

## 2021-06-20 MED ORDER — FENTANYL CITRATE (PF) 100 MCG/2ML IJ SOLN
INTRAMUSCULAR | Status: AC
  Filled 2021-06-20: qty 2

## 2021-06-20 MED ORDER — BUPIVACAINE-EPINEPHRINE (PF) 0.25% -1:200000 IJ SOLN
INTRAMUSCULAR | Status: AC
  Filled 2021-06-20: qty 30

## 2021-06-20 MED ORDER — BUPIVACAINE LIPOSOME 1.3 % IJ SUSP
INTRAMUSCULAR | Status: DC | PRN
  Administered 2021-06-20: 17.5 mL

## 2021-06-20 MED ORDER — DEXAMETHASONE SODIUM PHOSPHATE 10 MG/ML IJ SOLN
INTRAMUSCULAR | Status: DC | PRN
  Administered 2021-06-20: 5 mg via INTRAVENOUS

## 2021-06-20 SURGICAL SUPPLY — 43 items
BENZOIN TINCTURE PRP APPL 2/3 (GAUZE/BANDAGES/DRESSINGS) ×2 IMPLANT
BLADE CLIPPER SURG (BLADE) IMPLANT
CANISTER SUCT 3000ML PPV (MISCELLANEOUS) IMPLANT
CHLORAPREP W/TINT 26 (MISCELLANEOUS) ×2 IMPLANT
CLSR STERI-STRIP ANTIMIC 1/2X4 (GAUZE/BANDAGES/DRESSINGS) ×2 IMPLANT
COVER SURGICAL LIGHT HANDLE (MISCELLANEOUS) ×2 IMPLANT
COVER WAND RF STERILE (DRAPES) ×2 IMPLANT
DRAIN PENROSE 1/2X12 LTX STRL (WOUND CARE) IMPLANT
DRAPE LAPAROTOMY TRNSV 102X78 (DRAPES) IMPLANT
DRSG TEGADERM 4X4.75 (GAUZE/BANDAGES/DRESSINGS) ×2 IMPLANT
ELECT REM PT RETURN 9FT ADLT (ELECTROSURGICAL) ×2
ELECTRODE REM PT RTRN 9FT ADLT (ELECTROSURGICAL) ×1 IMPLANT
GAUZE 4X4 16PLY RFD (DISPOSABLE) ×2 IMPLANT
GAUZE SPONGE 4X4 12PLY STRL (GAUZE/BANDAGES/DRESSINGS) ×2 IMPLANT
GAUZE SPONGE 4X4 12PLY STRL LF (GAUZE/BANDAGES/DRESSINGS) ×2 IMPLANT
GLOVE SURG ENC MOIS LTX SZ6 (GLOVE) ×2 IMPLANT
GLOVE SURG UNDER LTX SZ6.5 (GLOVE) ×2 IMPLANT
GOWN STRL REUS W/ TWL LRG LVL3 (GOWN DISPOSABLE) ×2 IMPLANT
GOWN STRL REUS W/TWL LRG LVL3 (GOWN DISPOSABLE) ×2
KIT BASIN OR (CUSTOM PROCEDURE TRAY) ×2 IMPLANT
KIT TURNOVER KIT B (KITS) ×2 IMPLANT
MESH ULTRAPRO 3X6 7.6X15CM (Mesh General) ×2 IMPLANT
NEEDLE HYPO 25GX1X1/2 BEV (NEEDLE) ×2 IMPLANT
NS IRRIG 1000ML POUR BTL (IV SOLUTION) ×2 IMPLANT
PACK GENERAL/GYN (CUSTOM PROCEDURE TRAY) ×2 IMPLANT
PAD ARMBOARD 7.5X6 YLW CONV (MISCELLANEOUS) ×2 IMPLANT
PENCIL SMOKE EVACUATOR (MISCELLANEOUS) ×2 IMPLANT
STRIP CLOSURE SKIN 1/2X4 (GAUZE/BANDAGES/DRESSINGS) ×4 IMPLANT
SUT ETHIBOND 0 MO6 C/R (SUTURE) ×2 IMPLANT
SUT MNCRL AB 4-0 PS2 18 (SUTURE) ×2 IMPLANT
SUT SILK 3 0 (SUTURE)
SUT SILK 3-0 18XBRD TIE 12 (SUTURE) IMPLANT
SUT VIC AB 0 CT2 27 (SUTURE) ×2 IMPLANT
SUT VIC AB 2-0 SH 27 (SUTURE) ×1
SUT VIC AB 2-0 SH 27X BRD (SUTURE) ×1 IMPLANT
SUT VIC AB 3-0 SH 27 (SUTURE) ×1
SUT VIC AB 3-0 SH 27XBRD (SUTURE) ×1 IMPLANT
SUT VICRYL AB 3 0 TIES (SUTURE) ×2 IMPLANT
SYR CONTROL 10ML LL (SYRINGE) ×2 IMPLANT
TOWEL GREEN STERILE (TOWEL DISPOSABLE) ×2 IMPLANT
TOWEL GREEN STERILE FF (TOWEL DISPOSABLE) ×2 IMPLANT
TRAY FOLEY W/BAG SLVR 16FR (SET/KITS/TRAYS/PACK)
TRAY FOLEY W/BAG SLVR 16FR ST (SET/KITS/TRAYS/PACK) IMPLANT

## 2021-06-20 NOTE — Discharge Instructions (Addendum)
HERNIA REPAIR: POST OP INSTRUCTIONS   EAT Gradually transition to a high fiber diet with a fiber supplement over the next few weeks after discharge.  Start with a pureed / full liquid diet (see below)  WALK Walk an hour a day (cumulative- not all at once).  Control your pain to do that.    CONTROL PAIN Control pain so that you can walk, sleep, tolerate sneezing/coughing, and go up/down stairs.  HAVE A BOWEL MOVEMENT DAILY Keep your bowels regular to avoid problems.  OK to try a laxative to override constipation.  OK to use an antidairrheal to slow down diarrhea.  Call if not better after 2 tries  CALL IF YOU HAVE PROBLEMS/CONCERNS Call if you are still struggling despite following these instructions. Call if you have concerns not answered by these instructions  ######################################################################    DIET: Follow a light bland diet & liquids the first 24 hours after arrival home, such as soup, liquids, starches, etc.  Be sure to drink plenty of fluids.  Quickly advance to a usual solid diet within a few days.  Avoid fast food or heavy meals as your are more likely to get nauseated or have irregular bowels.  A low-sugar, high-fiber diet for the rest of your life is ideal.   Take your usually prescribed home medications unless otherwise directed.  PAIN CONTROL: Pain is best controlled by a usual combination of three different methods TOGETHER: Ice/Heat Over the counter pain medication Prescription pain medication Most patients will experience some swelling and bruising around the hernia(s) such as the bellybutton, groins, or old incisions.  Ice packs or heating pads (30-60 minutes up to 6 times a day) will help. Use ice for the first few days to help decrease swelling and bruising, then switch to heat to help relax tight/sore spots and speed recovery.  Some people prefer to use ice alone, heat alone, alternating between ice & heat.  Experiment to what  works for you.  Swelling and bruising can take several weeks to resolve.   It is helpful to take an over-the-counter pain medication regularly for the first days: Naproxen (Aleve, etc)  Two 220mg tabs twice a day OR Ibuprofen (Advil, etc) Three 200mg tabs four times a day (every meal & bedtime) AND Acetaminophen (Tylenol, etc) 325-650mg four times a day (every meal & bedtime) A  prescription for pain medication should be given to you upon discharge.  Take your pain medication as prescribed, IF NEEDED.  If you are having problems/concerns with the prescription medicine (does not control pain, nausea, vomiting, rash, itching, etc), please call us [phone] to see if we need to switch you to a different pain medicine that will work better for you and/or control your side effect better. If you need a refill on your pain medication, please contact your pharmacy.  They will contact our office to request authorization. Prescriptions will not be filled after 5 pm or on week-ends.  Avoid getting constipated.  Between the surgery and the pain medications, it is common to experience some constipation.  Increasing fluid intake and taking a fiber supplement (such as Metamucil, Citrucel, FiberCon, MiraLax, etc) 1-2 times a day regularly will usually help prevent this problem from occurring.  A mild laxative (prune juice, Milk of Magnesia, MiraLax, etc) should be taken according to package directions if there are no bowel movements after 48 hours.    Wash / shower every day, starting 2 days after surgery.  You may shower over the   steri strips which are waterproof.  No soaking or swimming until fully healed. No lotions, ointments, rubbing or scrubbing incision.   Remove your outer bandage 2 days after surgery.  Steri strips will peel off after 1-2 weeks. You may leave the incision open to air.  You may replace a dressing/Band-Aid to cover an incision for comfort if you wish.  Continue to shower over incision(s)  after the dressing is off.  ACTIVITIES as tolerated:   You may resume regular (light) daily activities beginning the next day--such as daily self-care, walking, climbing stairs--gradually increasing activities as tolerated.  Control your pain so that you can walk an hour a day.  If you can walk 30 minutes without difficulty, it is safe to try more intense activity such as jogging, treadmill, bicycling, low-impact aerobics, swimming, etc. Refrain from the most intensive and strenuous activity such as sit-ups, heavy lifting, contact sports, etc  Refrain from any heavy lifting or straining until 6 weeks after surgery.   DO NOT PUSH THROUGH PAIN.  Let pain be your guide: If it hurts to do something, don't do it.  Pain is your body warning you to avoid that activity for another week until the pain goes down. You may drive when you are no longer taking prescription pain medication, you can comfortably wear a seatbelt, and you can safely maneuver your car and apply brakes. You may have sexual intercourse when it is comfortable.   FOLLOW UP in our office Please call [facility] at [phone] to set up an appointment to see your surgeon in the office for a follow-up appointment approximately 2-3 weeks after your surgery. Make sure that you call for this appointment the day you arrive home to insure a convenient appointment time.  9.  If you have disability of FMLA / Family leave forms, please bring the forms to the office for processing.  (do not give to your surgeon).  WHEN TO CALL us [phone]: Poor pain control Reactions / problems with new medications (rash/itching, nausea, etc)  Fever over 101.5 F (38.5 C) Inability to urinate Nausea and/or vomiting Worsening swelling or bruising Continued bleeding from incision. Increased pain, redness, or drainage from the incision   The clinic staff is available to answer your questions during regular business hours (8:30am-5pm).  Please don't hesitate to  call and ask to speak to one of our nurses for clinical concerns.   If you have a medical emergency, go to the nearest emergency room or call 911.  A surgeon from [facility] Surgery is always on call at the hospitals in [facility], [place] [address], [place], [place]  [address] ?  [address] [address], [place], [place]   [address] MAIN: [phone] ? TOLL FREE: [phone] ? FAX: [phone] [email or web address]

## 2021-06-20 NOTE — Interval H&P Note (Signed)
History and Physical Interval Note:  06/20/2021 3:09 PM  Devin Goodman  has presented today for surgery, with the diagnosis of RIGHT INGUINAL HERNIA.  The various methods of treatment have been discussed with the patient and family. After consideration of risks, benefits and other options for treatment, the patient has consented to  Procedure(s): OPEN RIGHT INGUINAL HERNIA REPAIR (Right) as a surgical intervention.  The patient's history has been reviewed, patient examined, no change in status, stable for surgery.  I have reviewed the patient's chart and labs.  Questions were answered to the patient's satisfaction.     Lucca Ballo Lollie Sails

## 2021-06-20 NOTE — Transfer of Care (Signed)
Immediate Anesthesia Transfer of Care Note  Patient: Devin Goodman  Procedure(s) Performed: OPEN RIGHT INGUINAL HERNIA REPAIR (Right)  Patient Location: PACU  Anesthesia Type:General  Level of Consciousness: awake and alert   Airway & Oxygen Therapy: Patient Spontanous Breathing and Patient connected to face mask oxygen  Post-op Assessment: Report given to RN and Post -op Vital signs reviewed and stable  Post vital signs: Reviewed and stable  Last Vitals:  Vitals Value Taken Time  BP 171/92 06/20/21 1735  Temp    Pulse 72 06/20/21 1737  Resp 19 06/20/21 1737  SpO2 100 % 06/20/21 1737  Vitals shown include unvalidated device data.  Last Pain:  Vitals:   06/20/21 1320  PainSc: 0-No pain         Complications: No notable events documented.

## 2021-06-20 NOTE — Anesthesia Procedure Notes (Signed)
Procedure Name: Intubation Date/Time: 06/20/2021 4:23 PM Performed by: Eligha Bridegroom, CRNA Pre-anesthesia Checklist: Patient identified, Emergency Drugs available, Suction available and Patient being monitored Patient Re-evaluated:Patient Re-evaluated prior to induction Oxygen Delivery Method: Circle System Utilized Preoxygenation: Pre-oxygenation with 100% oxygen Induction Type: IV induction Ventilation: Mask ventilation without difficulty Laryngoscope Size: Mac and 4 Grade View: Grade I Tube type: Oral Tube size: 7.5 mm Number of attempts: 1 Airway Equipment and Method: Stylet and Oral airway Placement Confirmation: ETT inserted through vocal cords under direct vision, positive ETCO2 and breath sounds checked- equal and bilateral Secured at: 23 cm Tube secured with: Tape Dental Injury: Teeth and Oropharynx as per pre-operative assessment  Comments: Inserted by Paulina Fusi, SRNA

## 2021-06-20 NOTE — Anesthesia Postprocedure Evaluation (Signed)
Anesthesia Post Note  Patient: Muhanad Torosyan  Procedure(s) Performed: OPEN RIGHT INGUINAL HERNIA REPAIR (Right)     Patient location during evaluation: PACU Anesthesia Type: General Level of consciousness: awake and alert, oriented and patient cooperative Pain management: pain level controlled Vital Signs Assessment: post-procedure vital signs reviewed and stable Respiratory status: spontaneous breathing, nonlabored ventilation and respiratory function stable Cardiovascular status: blood pressure returned to baseline and stable Postop Assessment: no apparent nausea or vomiting Anesthetic complications: no   No notable events documented.  Last Vitals:  Vitals:   06/20/21 1259 06/20/21 1734  BP: (!) 145/89 (!) 171/92  Pulse: 62 73  Resp: 18 11  Temp: 36.4 C 36.7 C  SpO2: 100% 100%    Last Pain:  Vitals:   06/20/21 1734  PainSc: Asleep                 Lannie Fields

## 2021-06-20 NOTE — Op Note (Signed)
Operative Note  Devin Goodman  [id number]  [id number]  06/20/2021   Surgeon: Phylliss Blakes MD FACS   Assistant: Hedda Slade PA-C   Procedure performed: Open repair of indirect right inguinal hernia with mesh   Preop diagnosis:  right inguinal hernia   Post-op diagnosis/intraop findings: same,  Large indirect sac   Specimens: none   EBL: 10cc   Complications: none   Description of procedure: After obtaining informed consent the patient was taken to the operating room and placed supine on operating room table where general anesthesia was initiated, preoperative antibiotics were administered, SCDs applied, and a formal timeout was performed.  Foley catheter inserted which is removed at the end of the case.  The groin was clipped, prepped and draped in the usual sterile fashion. An oblique incision was made in the just above the inguinal ligament after infiltrating the tissues with local anesthetic (Exparel mixed with quarter percent Marcaine with epinephrine). Soft tissues were dissected using electrocautery until the external oblique aponeurosis was encountered. This was divided sharply to expand the external ring. A plane was bluntly developed between the spermatic cord and the external oblique. The ilioinguinal nerve was divided between hemostats and each and ligated with 3-0 Vicryl ties. The spermatic cord was then bluntly dissected away from the pubic tubercle and encircled with a Penrose. Inspection of the inguinal anatomy revealed a large indirect hernia. The indirect hernia sac was bluntly dissected away from the cord structures. Once we had affirmatively identified the sac, it was carefully opened in a region that was very thin. Inspection confirmed communication with the peritoneal cavity with no bowel contained currently. The sac was clamped and the excess excised at the level of the internal ring, where the remaining sac was suture ligated with a 0 vicryl pursestring and reduced into  the abdomen.  The internal ring was then narrowed slightly with a lateral and medial suture of 0 Vicryl. A 3 x 6 piece of ultra Pro mesh was brought onto the field and trimmed to approximate the field. This was sutured to the pubic tubercle fascia using 0 ethibond. Interrupted 0 ethibonds were then used to suture the mesh to the inferior shelving edge and to the internal oblique superiorly. The tails of the mesh were wrapped around the spermatic cord, ensuring adequate room for the cord, and sutured to each other with 0 ethibond, and then directed laterally to lie flat underneath the external oblique aponeurosis. Hemostasis was ensured within the wound. The Penrose was removed. The external oblique aponeurosis was reapproximated with a running 2-0 Vicryl to re-create a narrowed external ring. More local was infiltrated around the pubic tubercle and in the plane just below the external oblique. The Scarpa's was reapproximated with interrupted 3-0 Vicryls. The skin was closed with a running subcuticular 4-0 Monocryl. The remainder of the local was injected in the subcutaneous and subcuticular space. The field was then cleaned, benzoin and Steri-Strips and sterile bandage were applied. The patient was then awakened extubated and taken to PACU in stable condition.    All counts were correct at the completion of the case

## 2021-06-21 ENCOUNTER — Encounter (HOSPITAL_COMMUNITY): Payer: Self-pay | Admitting: Surgery

## 2021-07-04 ENCOUNTER — Encounter (HOSPITAL_COMMUNITY): Payer: Self-pay | Admitting: Surgery

## 2021-07-06 ENCOUNTER — Other Ambulatory Visit (HOSPITAL_COMMUNITY): Payer: Self-pay | Admitting: Adult Health

## 2021-07-06 ENCOUNTER — Other Ambulatory Visit: Payer: Self-pay | Admitting: Family Medicine

## 2021-07-09 ENCOUNTER — Ambulatory Visit: Payer: Medicare Other | Admitting: Family Medicine

## 2021-07-09 NOTE — Progress Notes (Incomplete)
   Devin Goodman is a 65 y.o. male who presents today for an office visit.  Assessment/Plan:  New/Acute Problems: ***  Chronic Problems Addressed Today: No problem-specific Assessment & Plan notes found for this encounter.     Subjective:  HPI:  ***        Objective:  Physical Exam: There were no vitals taken for this visit.  Gen: No acute distress, resting comfortably*** CV: Regular rate and rhythm with no murmurs appreciated Pulm: Normal work of breathing, clear to auscultation bilaterally with no crackles, wheezes, or rhonchi Neuro: Grossly normal, moves all extremities Psych: Normal affect and thought content      I,Jordan Kelly,acting as a scribe for Jacquiline Doe, MD.,have documented all relevant documentation on the behalf of Jacquiline Doe, MD,as directed by  Jacquiline Doe, MD while in the presence of Jacquiline Doe, MD. *** [name]. [name], MD 07/09/2021 7:54 AM

## 2021-07-11 ENCOUNTER — Ambulatory Visit (INDEPENDENT_AMBULATORY_CARE_PROVIDER_SITE_OTHER): Payer: Medicare Other | Admitting: Family Medicine

## 2021-07-11 ENCOUNTER — Other Ambulatory Visit: Payer: Self-pay

## 2021-07-11 ENCOUNTER — Encounter: Payer: Self-pay | Admitting: Family Medicine

## 2021-07-11 VITALS — BP 104/66 | HR 91 | Temp 98.3°F | Ht 72.0 in | Wt 171.2 lb

## 2021-07-11 DIAGNOSIS — E1165 Type 2 diabetes mellitus with hyperglycemia: Secondary | ICD-10-CM | POA: Diagnosis not present

## 2021-07-11 DIAGNOSIS — E785 Hyperlipidemia, unspecified: Secondary | ICD-10-CM | POA: Diagnosis not present

## 2021-07-11 DIAGNOSIS — I10 Essential (primary) hypertension: Secondary | ICD-10-CM

## 2021-07-11 DIAGNOSIS — Z23 Encounter for immunization: Secondary | ICD-10-CM | POA: Diagnosis not present

## 2021-07-11 DIAGNOSIS — Z1211 Encounter for screening for malignant neoplasm of colon: Secondary | ICD-10-CM

## 2021-07-11 MED ORDER — KETOCONAZOLE 2 % EX CREA
1.0000 | TOPICAL_CREAM | Freq: Two times a day (BID) | CUTANEOUS | 0 refills | Status: DC
Start: 2021-07-11 — End: 2021-10-08

## 2021-07-11 NOTE — Assessment & Plan Note (Signed)
On atorvastatin 20 mg daily 

## 2021-07-11 NOTE — Assessment & Plan Note (Signed)
Last A1c 6.3.  He is tolerating [place] well although is having some recurrent issues with balanitis.  His cardiologist recently prescribed him a single dose of Diflucan which seems to help.  He has had small amount of irritation here there since then.  We will start ketoconazole cream to use as needed.  Discussed reasons to return to care.

## 2021-07-11 NOTE — Assessment & Plan Note (Signed)
At goal on Coreg 3.125 mg twice daily, Entresto 97-1 03 twice daily, spironolactone 25 mg daily, and BiDil 20-30 7.53 times daily.

## 2021-07-11 NOTE — Addendum Note (Signed)
Addended by: Dyann Kief on: 07/11/2021 10:48 AM   Modules accepted: Orders

## 2021-07-11 NOTE — Patient Instructions (Signed)
It was very nice to see you today!  I will send in a ketoconazole cream for your rash.  Please use this for any signs of flareup.  Your A1c looks fine.  No need to make any other medication changes today.  I will refer you for your colonoscopy.  We will also give your pneumonia vaccine today.  I will see you back in 6 months.  Please come back to see me sooner if needed.  Take care, Dr [name]  PLEASE NOTE:  If you had any lab tests please let us know if you have not heard back within a few days. You may see your results on mychart before we have a chance to review them but we will give you a call once they are reviewed by [place]. If we ordered any referrals today, please let us know if you have not heard from their office within the next week.   Please try these tips to maintain a healthy lifestyle:  Eat at least 3 REAL meals and 1-2 snacks per day.  Aim for no more than 5 hours between eating.  If you eat breakfast, please do so within one hour of getting up.   Each meal should contain half fruits/vegetables, one quarter protein, and one quarter carbs (no bigger than a computer mouse)  Cut down on sweet beverages. This includes juice, soda, and sweet tea.   Drink at least 1 glass of water with each meal and aim for at least 8 glasses per day  Exercise at least 150 minutes every week.

## 2021-07-11 NOTE — Progress Notes (Signed)
   Markevion Lattin is a 65 y.o. male who presents today for an office visit.  Assessment/Plan:  Chronic Problems Addressed Today: No problem-specific Assessment & Plan notes found for this encounter.  Preventative Healthcare Referral for colonoscopy.  Prevnar 20 given today.    Subjective:  HPI:  See a/p.        Objective:  Physical Exam: BP 104/66   Pulse 91   Temp 98.3 F (36.8 C) (Temporal)   Ht 6' (1.829 m)   Wt 171 lb 3.2 oz (77.7 kg)   SpO2 99%   BMI 23.22 kg/m   Gen: No acute distress, resting comfortably CV: Regular rate and rhythm with no murmurs appreciated Pulm: Normal work of breathing, clear to auscultation bilaterally with no crackles, wheezes, or rhonchi Neuro: Grossly normal, moves all extremities Psych: Normal affect and thought content      Chade Pitner M. [name], MD 07/11/2021 10:37 AM

## 2021-10-08 ENCOUNTER — Telehealth: Payer: Self-pay

## 2021-10-08 ENCOUNTER — Other Ambulatory Visit: Payer: Self-pay | Admitting: Family Medicine

## 2021-10-08 ENCOUNTER — Other Ambulatory Visit (HOSPITAL_COMMUNITY): Payer: Self-pay | Admitting: Adult Health

## 2021-10-08 MED ORDER — KETOCONAZOLE 2 % EX CREA: 1.0000 "application " | TOPICAL_CREAM | Freq: Two times a day (BID) | CUTANEOUS | 0 refills | Status: DC

## 2021-10-08 NOTE — Telephone Encounter (Signed)
MEDICATION: ketoconazole (NIZORAL) 2 % cream  PHARMACY:  [id number] [place], [place] - [address] Phone:  [phone]  Fax:  [phone]      Comments: Patient states he is having a flair up.   **Let patient know to contact pharmacy at the end of the day to make sure medication is ready. **  ** Please notify patient to allow 48-72 hours to process**  **Encourage patient to contact the pharmacy for refills or they can request refills through [facility]**

## 2021-10-08 NOTE — Telephone Encounter (Signed)
Medication refilled

## 2021-11-02 ENCOUNTER — Other Ambulatory Visit: Payer: Self-pay | Admitting: Family Medicine

## 2021-11-14 ENCOUNTER — Telehealth: Payer: Self-pay

## 2021-11-14 NOTE — Telephone Encounter (Signed)
LAST APPOINTMENT DATE:  07/11/21  NEXT APPOINTMENT DATE: 01/11/22  MEDICATION:spironolactone (ALDACTONE) 25 MG tablet  [facility]ACY: [facility]  [id number], [place], [place] [address]

## 2021-11-15 ENCOUNTER — Other Ambulatory Visit (HOSPITAL_COMMUNITY): Payer: Self-pay | Admitting: Adult Health

## 2021-11-15 NOTE — Telephone Encounter (Signed)
Patient notified Rx refilled by Cardiology Bensimhon, Daniel MD Need to call his office for refill  Patient agreed stated will call tomorrow

## 2021-11-15 NOTE — Telephone Encounter (Signed)
Pt called to check the status of his medication. Pt would like a call when refilled.

## 2021-11-16 ENCOUNTER — Other Ambulatory Visit (HOSPITAL_COMMUNITY): Payer: Self-pay | Admitting: *Deleted

## 2021-11-16 MED ORDER — SPIRONOLACTONE 25 MG PO TABS: 25.0000 mg | ORAL_TABLET | Freq: Every day | ORAL | 3 refills | Status: AC

## 2021-12-04 IMAGING — CT CT ANGIO CHEST
2 of 6 series · 18 of 46 positions shown · IV contrast (omnipaque)
Comparison: None.

CLINICAL DATA: Sudden onset shortness of breath

EXAM:
CT ANGIOGRAPHY CHEST WITH CONTRAST
TECHNIQUE: Multidetector CT imaging of the chest was performed using the
standard protocol during bolus administration of intravenous
contrast. Multiplanar CT image reconstructions and MIPs were
obtained to evaluate the vascular anatomy.
CONTRAST:  100mL OMNIPAQUE IOHEXOL 350 MG/ML SOLN

[Series 8: thins · axial · 0.68mm/px · z∈[+1236,+1561]mm · 15 of 509 slices shown]
[im 23/509  lung]
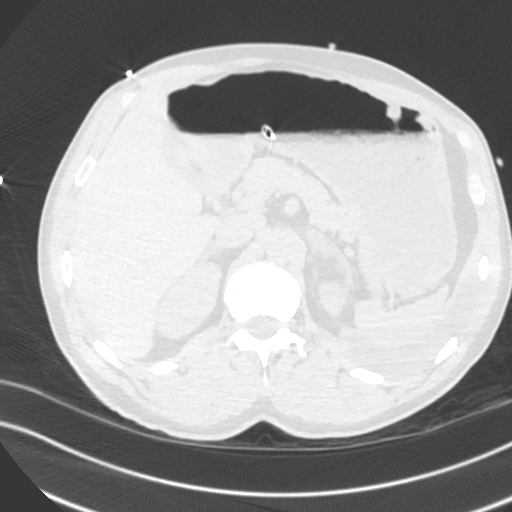
[im 67/509  soft-tissue]
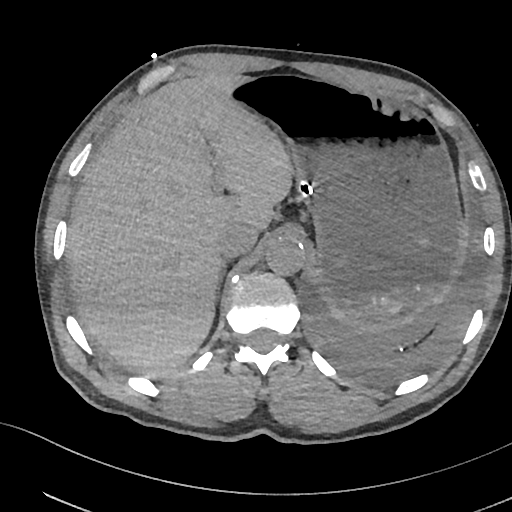
[im 89/509  lung]
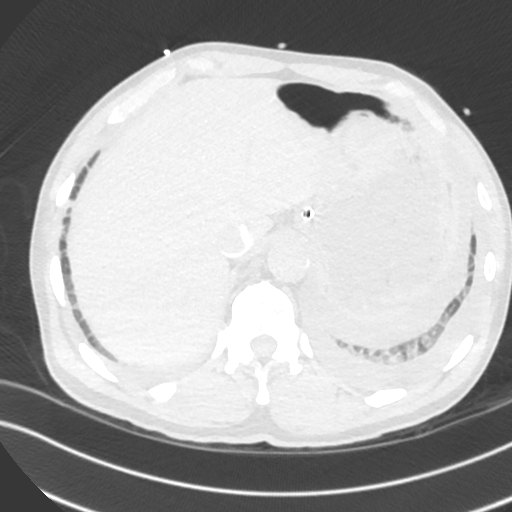
[im 133/509  soft-tissue]
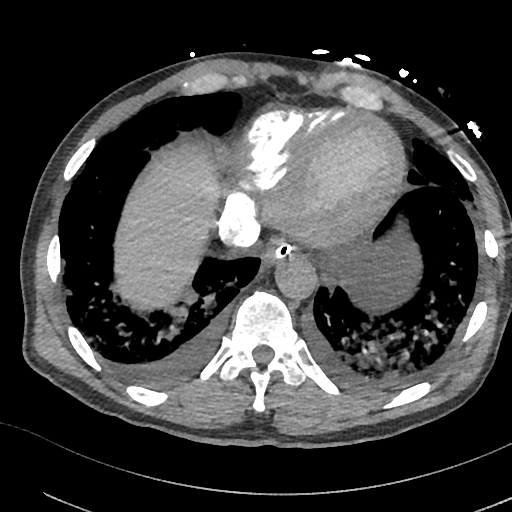
[im 155/509  lung]
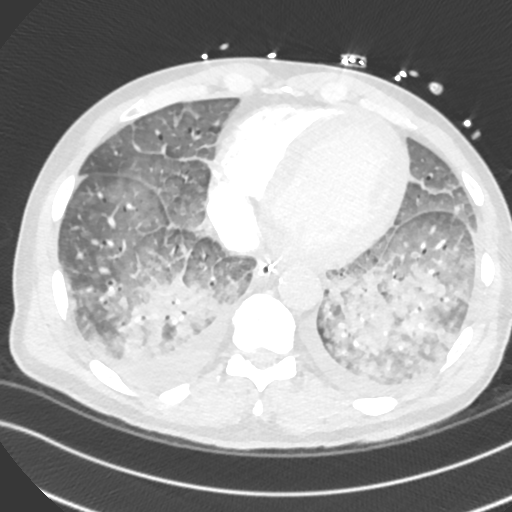
[im 199/509  soft-tissue]
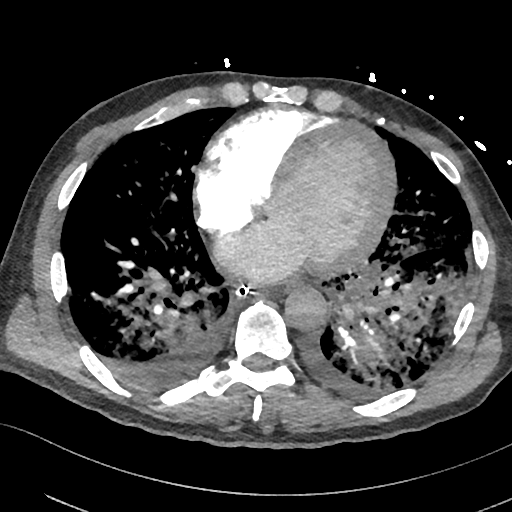
[im 221/509  lung]
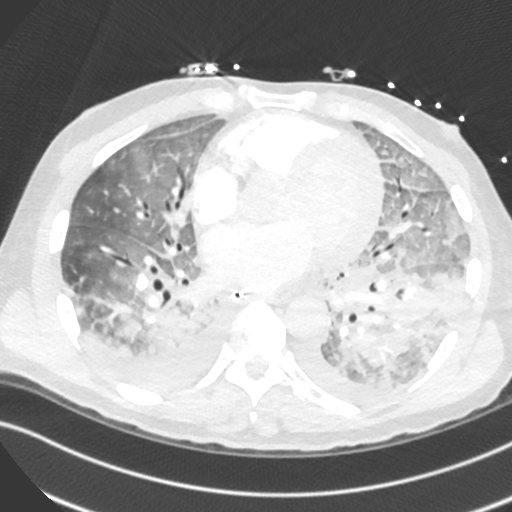
[im 266/509  soft-tissue]
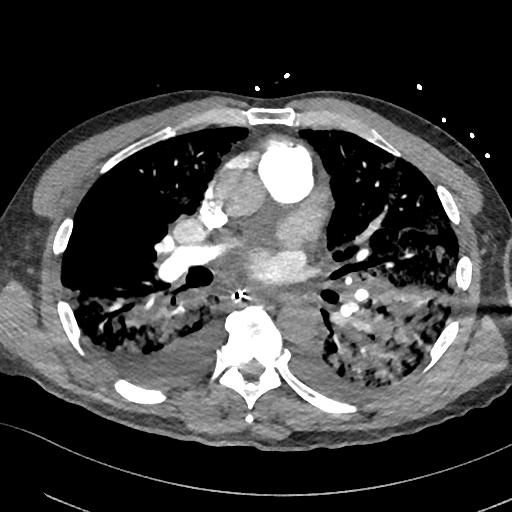
[im 288/509  lung]
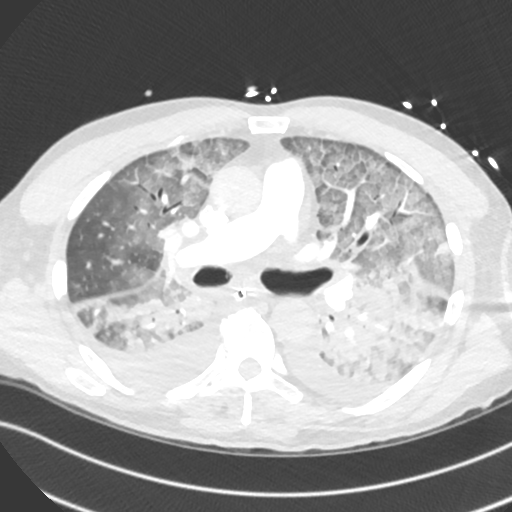
[im 310/509  soft-tissue]
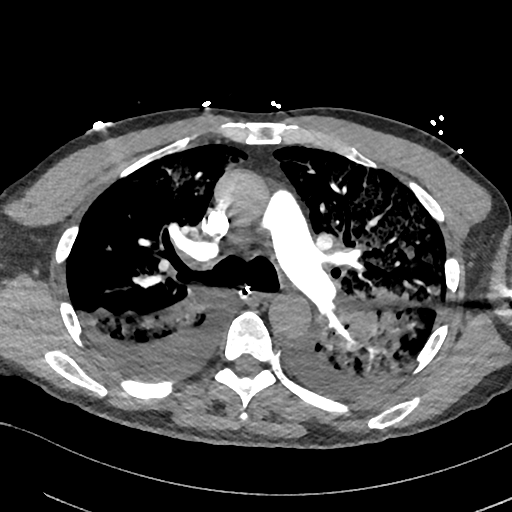
[im 354/509  lung]
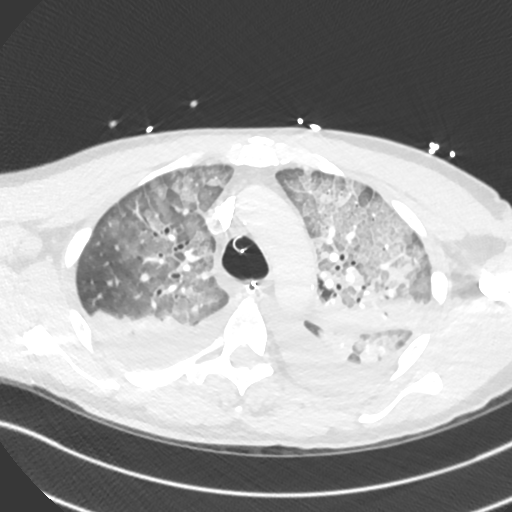
[im 376/509  soft-tissue]
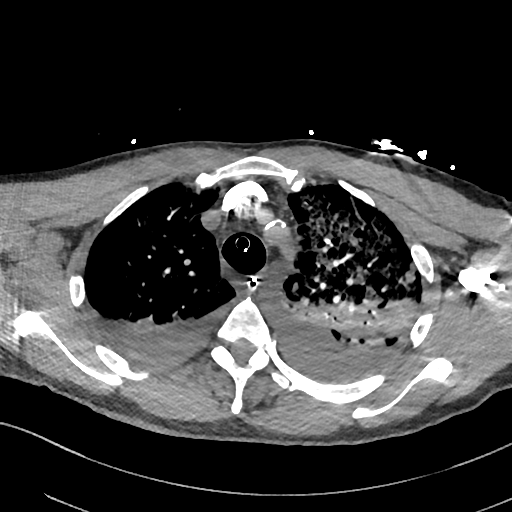
[im 420/509  lung]
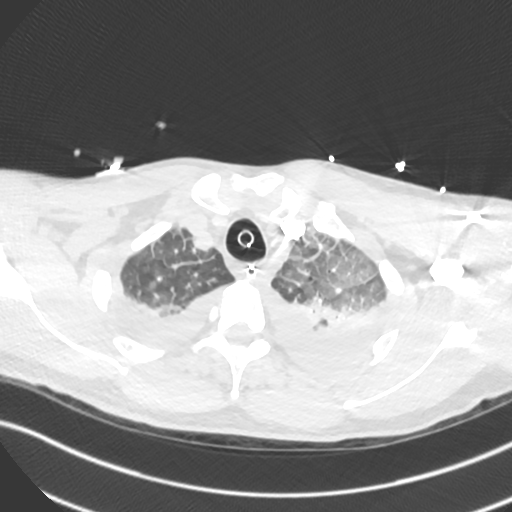
[im 442/509  soft-tissue]
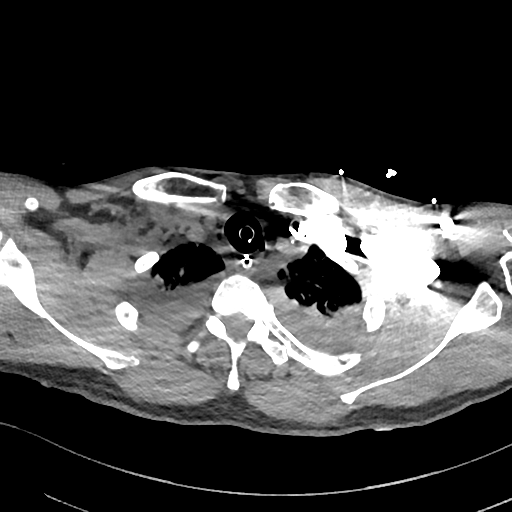
[im 486/509  lung]
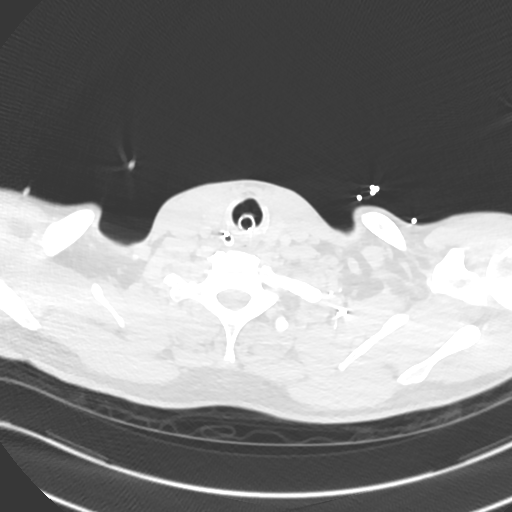

[Series 9: cor · coronal · 0.75mm/px · 3 of 135 slices shown]
[im 34/135  soft-tissue]
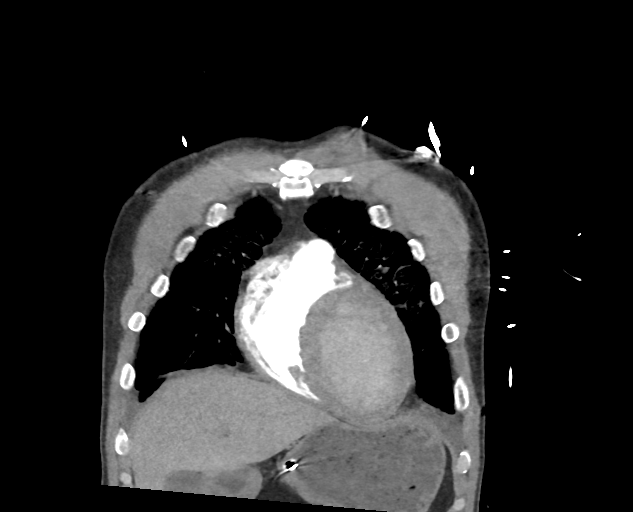
[im 68/135  soft-tissue]
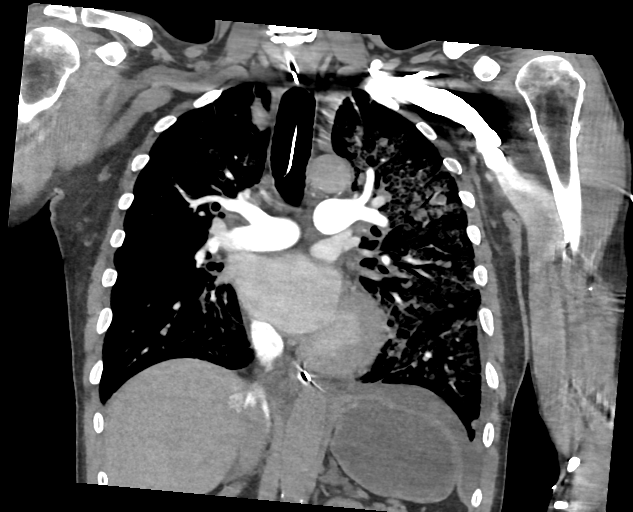
[im 101/135  soft-tissue]
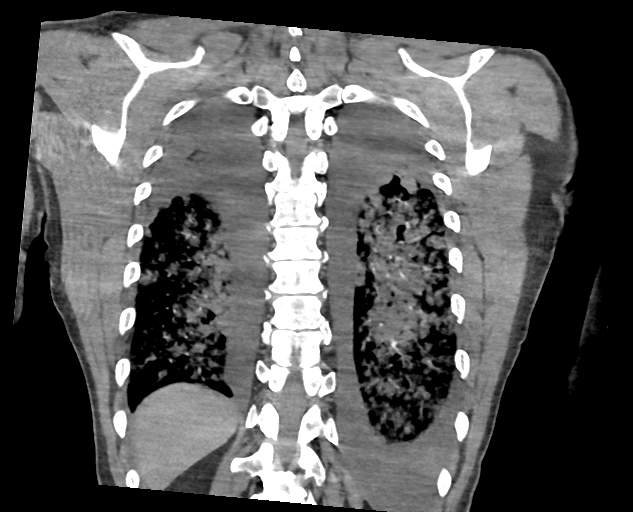

[18 of 46 positions shown; findings below may reference images not displayed]

FINDINGS: Cardiovascular: Contrast injection is sufficient to demonstrate
satisfactory opacification of the pulmonary arteries to the
segmental level. There is no pulmonary embolus or evidence of right
heart strain. The size of the main pulmonary artery is normal. Mild
cardiomegaly. The course and caliber of the aorta are normal. There
is mild atherosclerotic calcification. Opacification decreased due
to pulmonary arterial phase contrast bolus timing.

Mediastinum/Nodes: No mediastinal, hilar or axillary
lymphadenopathy. Normal visualized thyroid. Thoracic esophageal
course is normal.

Lungs/Pleura: Diffuse ground-glass opacity throughout both lungs
with large areas of consolidation in the lower lobes. Small pleural
effusions.

Upper Abdomen: Contrast bolus timing is not optimized for evaluation
of the abdominal organs. The visualized portions of the organs of
the upper abdomen are normal.

Musculoskeletal: No chest wall abnormality. No bony spinal canal
stenosis.

Review of the MIP images confirms the above findings.
IMPRESSION: 1. No pulmonary embolus or acute aortic syndrome.
2. Diffuse ground-glass opacity throughout both lungs with large
areas of consolidation in the lower lobes, possibly ARDS.
3. Small pleural effusions.

Aortic Atherosclerosis ([id number]).

## 2022-01-11 ENCOUNTER — Ambulatory Visit: Payer: Medicare Other | Admitting: Family Medicine

## 2022-02-15 ENCOUNTER — Other Ambulatory Visit: Payer: Self-pay | Admitting: *Deleted

## 2022-02-15 MED ORDER — KETOCONAZOLE 2 % EX CREA: 1.0000 "application " | TOPICAL_CREAM | Freq: Two times a day (BID) | CUTANEOUS | 0 refills | Status: AC

## 2022-02-22 ENCOUNTER — Other Ambulatory Visit (HOSPITAL_COMMUNITY): Payer: Self-pay | Admitting: Family Medicine

## 2022-02-23 ENCOUNTER — Other Ambulatory Visit (HOSPITAL_COMMUNITY): Payer: Self-pay | Admitting: Adult Health

## 2022-05-08 ENCOUNTER — Other Ambulatory Visit: Payer: Self-pay | Admitting: Family Medicine

## 2022-05-20 ENCOUNTER — Telehealth: Payer: Self-pay | Admitting: Family Medicine

## 2022-05-20 NOTE — Telephone Encounter (Signed)
Copied from CRM [id number]. Topic: Medicare AWV >> May 20, 2022 10:18 AM Harris-Coley, Avon Gully wrote: Reason for CRM: Left message for patient to schedule Annual Wellness Visit.  Please schedule with Nurse Health Advisor Lanier Ensign, RN at [facility].  Please call [phone] ask for [facility]

## 2022-05-30 ENCOUNTER — Other Ambulatory Visit (HOSPITAL_COMMUNITY): Payer: Self-pay | Admitting: Internal Medicine

## 2022-06-05 ENCOUNTER — Other Ambulatory Visit: Payer: Self-pay | Admitting: Family Medicine

## 2022-07-10 ENCOUNTER — Other Ambulatory Visit: Payer: Self-pay | Admitting: Family Medicine

## 2022-07-26 ENCOUNTER — Other Ambulatory Visit (HOSPITAL_COMMUNITY): Payer: Self-pay | Admitting: Internal Medicine

## 2022-10-19 ENCOUNTER — Other Ambulatory Visit (HOSPITAL_COMMUNITY): Payer: Self-pay | Admitting: Cardiology

## 2022-10-19 ENCOUNTER — Other Ambulatory Visit (HOSPITAL_COMMUNITY): Payer: Self-pay | Admitting: Internal Medicine

## 2023-01-25 ENCOUNTER — Other Ambulatory Visit (HOSPITAL_COMMUNITY): Payer: Self-pay | Admitting: Cardiology

## 2023-05-03 ENCOUNTER — Other Ambulatory Visit (HOSPITAL_COMMUNITY): Payer: Self-pay | Admitting: Cardiology

## 2023-05-13 ENCOUNTER — Other Ambulatory Visit (HOSPITAL_COMMUNITY): Payer: Self-pay | Admitting: Cardiology

## 2023-05-25 ENCOUNTER — Other Ambulatory Visit (HOSPITAL_COMMUNITY): Payer: Self-pay | Admitting: Cardiology

## 2023-06-19 ENCOUNTER — Other Ambulatory Visit (HOSPITAL_COMMUNITY): Payer: Self-pay | Admitting: Cardiology

## 2023-07-06 ENCOUNTER — Other Ambulatory Visit (HOSPITAL_COMMUNITY): Payer: Self-pay | Admitting: Cardiology

## 2023-08-05 ENCOUNTER — Other Ambulatory Visit (HOSPITAL_COMMUNITY): Payer: Self-pay | Admitting: Cardiology
